# Patient Record
Sex: Female | Born: 1955 | Race: White | Hispanic: No | Smoking: Former smoker
Health system: Southern US, Community
[De-identification: ages and names within clinical notes are randomized; demographics above are authoritative.]

## PROBLEM LIST (undated history)

## (undated) DIAGNOSIS — E785 Hyperlipidemia, unspecified: Secondary | ICD-10-CM

## (undated) DIAGNOSIS — K219 Gastro-esophageal reflux disease without esophagitis: Secondary | ICD-10-CM

## (undated) DIAGNOSIS — J302 Other seasonal allergic rhinitis: Secondary | ICD-10-CM

## (undated) DIAGNOSIS — Z9889 Other specified postprocedural states: Secondary | ICD-10-CM

## (undated) DIAGNOSIS — I10 Essential (primary) hypertension: Secondary | ICD-10-CM

## (undated) DIAGNOSIS — E559 Vitamin D deficiency, unspecified: Secondary | ICD-10-CM

## (undated) DIAGNOSIS — R51 Headache: Secondary | ICD-10-CM

## (undated) DIAGNOSIS — R112 Nausea with vomiting, unspecified: Secondary | ICD-10-CM

## (undated) HISTORY — PX: LEG SURGERY: SHX1003

## (undated) HISTORY — PX: LAPAROTOMY: SHX154

## (undated) HISTORY — PX: APPENDECTOMY: SHX54

## (undated) HISTORY — PX: TUBAL LIGATION: SHX77

## (undated) HISTORY — PX: WISDOM TOOTH EXTRACTION: SHX21

---

## 2012-05-25 ENCOUNTER — Emergency Department (HOSPITAL_COMMUNITY): Payer: BC Managed Care – PPO

## 2012-05-25 ENCOUNTER — Emergency Department (HOSPITAL_COMMUNITY)
Admission: EM | Admit: 2012-05-25 | Discharge: 2012-05-25 | Disposition: A | Discharge status: Home / Self Care | Payer: BC Managed Care – PPO | Attending: Emergency Medicine | Admitting: Emergency Medicine

## 2012-05-25 ENCOUNTER — Encounter (HOSPITAL_COMMUNITY): Payer: Self-pay | Admitting: Emergency Medicine

## 2012-05-25 DIAGNOSIS — Y939 Activity, unspecified: Secondary | ICD-10-CM | POA: Insufficient documentation

## 2012-05-25 DIAGNOSIS — I10 Essential (primary) hypertension: Secondary | ICD-10-CM | POA: Insufficient documentation

## 2012-05-25 DIAGNOSIS — W1789XA Other fall from one level to another, initial encounter: Secondary | ICD-10-CM | POA: Insufficient documentation

## 2012-05-25 DIAGNOSIS — R109 Unspecified abdominal pain: Secondary | ICD-10-CM | POA: Insufficient documentation

## 2012-05-25 DIAGNOSIS — S99929A Unspecified injury of unspecified foot, initial encounter: Secondary | ICD-10-CM | POA: Insufficient documentation

## 2012-05-25 DIAGNOSIS — S8990XA Unspecified injury of unspecified lower leg, initial encounter: Secondary | ICD-10-CM | POA: Insufficient documentation

## 2012-05-25 DIAGNOSIS — R197 Diarrhea, unspecified: Secondary | ICD-10-CM | POA: Insufficient documentation

## 2012-05-25 DIAGNOSIS — Y929 Unspecified place or not applicable: Secondary | ICD-10-CM | POA: Insufficient documentation

## 2012-05-25 DIAGNOSIS — S0993XA Unspecified injury of face, initial encounter: Secondary | ICD-10-CM | POA: Insufficient documentation

## 2012-05-25 DIAGNOSIS — Z7982 Long term (current) use of aspirin: Secondary | ICD-10-CM | POA: Insufficient documentation

## 2012-05-25 DIAGNOSIS — R112 Nausea with vomiting, unspecified: Secondary | ICD-10-CM | POA: Insufficient documentation

## 2012-05-25 DIAGNOSIS — W19XXXA Unspecified fall, initial encounter: Secondary | ICD-10-CM

## 2012-05-25 DIAGNOSIS — IMO0001 Reserved for inherently not codable concepts without codable children: Secondary | ICD-10-CM | POA: Insufficient documentation

## 2012-05-25 HISTORY — DX: Essential (primary) hypertension: I10

## 2012-05-25 LAB — COMPREHENSIVE METABOLIC PANEL
Albumin: 4 g/dL (ref 3.5–5.2)
Alkaline Phosphatase: 67 U/L (ref 39–117)
BUN: 25 mg/dL — ABNORMAL HIGH (ref 6–23)
Calcium: 8.8 mg/dL (ref 8.4–10.5)
GFR calc Af Amer: >90 mL/min (ref >90)
Glucose, Bld: 133 mg/dL — ABNORMAL HIGH (ref 70–99)
Potassium: 3.6 mEq/L (ref 3.5–5.1)
Total Protein: 6.7 g/dL (ref 6.0–8.3)

## 2012-05-25 LAB — CBC WITH DIFFERENTIAL/PLATELET
Basophils Relative: 0 % (ref 0–1)
Eosinophils Absolute: 0 10*3/uL (ref 0.0–0.7)
Eosinophils Relative: 0 % (ref 0–5)
Hemoglobin: 14.1 g/dL (ref 12.0–15.0)
Lymphs Abs: 0.8 10*3/uL (ref 0.7–4.0)
MCH: 29.5 pg (ref 26.0–34.0)
MCHC: 34.8 g/dL (ref 30.0–36.0)
MCV: 84.7 fL (ref 78.0–100.0)
Monocytes Relative: 4 % (ref 3–12)
Neutrophils Relative %: 86 % — ABNORMAL HIGH (ref 43–77)
Platelets: 270 10*3/uL (ref 150–400)
RBC: 4.78 MIL/uL (ref 3.87–5.11)

## 2012-05-25 LAB — URINALYSIS, ROUTINE W REFLEX MICROSCOPIC
Bilirubin Urine: NEGATIVE
Hgb urine dipstick: NEGATIVE
Ketones, ur: NEGATIVE mg/dL
Nitrite: NEGATIVE
Urobilinogen, UA: 0.2 mg/dL (ref 0.0–1.0)

## 2012-05-25 LAB — LIPASE, BLOOD: Lipase: 19 U/L (ref 11–59)

## 2012-05-25 MED ORDER — IBUPROFEN 800 MG PO TABS: 800.0000 mg | ORAL_TABLET | Freq: Three times a day (TID) | ORAL | Status: DC

## 2012-05-25 MED ORDER — ONDANSETRON HCL 4 MG/2ML IJ SOLN
4.0000 mg | Freq: Once | INTRAMUSCULAR | Status: AC
  Administered 2012-05-25: 4 mg via INTRAVENOUS
  Filled 2012-05-25: qty 2

## 2012-05-25 MED ORDER — MORPHINE SULFATE 4 MG/ML IJ SOLN
4.0000 mg | Freq: Once | INTRAMUSCULAR | Status: AC
  Administered 2012-05-25: 4 mg via INTRAVENOUS
  Filled 2012-05-25 (×2): qty 1

## 2012-05-25 MED ORDER — IOHEXOL 300 MG/ML  SOLN
100.0000 mL | Freq: Once | INTRAMUSCULAR | Status: AC | PRN
  Administered 2012-05-25: 100 mL via INTRAVENOUS

## 2012-05-25 MED ORDER — SODIUM CHLORIDE 0.9 % IV BOLUS (SEPSIS)
1000.0000 mL | Freq: Once | INTRAVENOUS | Status: AC
  Administered 2012-05-25: 1000 mL via INTRAVENOUS

## 2012-05-25 NOTE — ED Provider Notes (Signed)
History   This chart was scribed for Victoria Octave, MD by Leone Payor, ED Scribe. This patient was seen in room APA01/APA01 and the patient's care was started at 1647.   CSN: [id number]  Arrival date & time 05/25/12  1405   First MD Initiated Contact with Patient 05/25/12 1647      Chief Complaint  Patient presents with  . Fall  . Flank Pain  . Emesis  . Diarrhea     The history is provided by the patient. No language interpreter was used.    Victoria Shannon is a 56 y.o. female who presents to the Emergency Department complaining of a fall landing on her left side. Pt denies any LOC and was able to pull herself up. Pt states having moderate to severe pain on whole left side, including left neck, left back, left flank down to left leg. Pt denies any head pain and any previous back pain. Pt states having nausea, vomiting, and diarrhea since last night.   Pt has h/o HTN and takes aspirin.   Past Medical History  Diagnosis Date  . Hypertension     Past Surgical History  Procedure Date  . Leg surgery     History reviewed. No pertinent family history.  History  Substance Use Topics  . Smoking status: Not on file  . Smokeless tobacco: Not on file  . Alcohol Use: No    OB History    Grav Para Term Preterm Abortions TAB SAB Ect Mult Living                  Review of Systems  Constitutional: Negative for fever and chills.  HENT: Positive for neck pain (left -sided).   Gastrointestinal: Positive for nausea, vomiting (last emesis last night. ) and diarrhea.  Musculoskeletal: Positive for myalgias (left side), back pain and arthralgias (left side).  Neurological: Negative for headaches.  All other systems reviewed and are negative.    Allergies  Penicillins  Home Medications  No current outpatient prescriptions on file.  BP 125/101  Pulse 60  Temp 98.8 F (37.1 C) (Oral)  Resp 17  Ht 5\' 5"  (1.651 m)  Wt 210 lb (95.255 kg)  BMI 34.95 kg/m2  SpO2  96%  Physical Exam  Nursing note and vitals reviewed. Constitutional: She is oriented to person, place, and time. She appears well-developed and well-nourished. No distress.  HENT:  Head: Normocephalic and atraumatic.  Eyes: EOM are normal.  Neck: Neck supple. No tracheal deviation present.  Cardiovascular: Normal rate, regular rhythm and normal heart sounds.   Pulmonary/Chest: Effort normal. No respiratory distress.  Abdominal: Soft. Bowel sounds are normal. There is no tenderness.  Musculoskeletal: Normal range of motion.       Paraspinal tenderness on left. Tenderness over left posterior ribs.  No c-spine tenderness.   5/5 strength in bilateral lower extremities. Ankle plantar and dorsiflexion intact. Great toe extension intact bilaterally. +2 DP and PT pulses. +2 patellar reflexes bilaterally. Normal gait.   Neurological: She is alert and oriented to person, place, and time.          Skin: Skin is warm and dry.  Psychiatric: She has a normal mood and affect. Her behavior is normal.    ED Course  Procedures (including critical care time)  DIAGNOSTIC STUDIES: Oxygen Saturation is 96% on room air, adequate by my interpretation.    COORDINATION OF CARE:  4:56 PMDiscussed treatment plan which includes x-ray and labs with pt at bedside and  pt agreed to plan.    Labs Reviewed  CBC WITH DIFFERENTIAL - Abnormal; Notable for the following:    Neutrophils Relative 86 (*)     Lymphocytes Relative 9 (*)     All other components within normal limits  COMPREHENSIVE METABOLIC PANEL - Abnormal; Notable for the following:    Glucose, Bld 133 (*)     BUN 25 (*)     All other components within normal limits  URINALYSIS, ROUTINE W REFLEX MICROSCOPIC  LIPASE, BLOOD   Dg Ribs Unilateral W/chest Left  05/25/2012  *RADIOLOGY REPORT*  Clinical Data: Posterior left rib pain secondary to a fall today.  LEFT RIBS AND CHEST - 3+ VIEW  Comparison: None.  Findings: There is no fracture.  There  is no lung contusion or pleural effusion.  Lungs are clear.  Heart size and vascularity are normal.  IMPRESSION: Normal exam.  No visible rib fracture.   Original Report Authenticated By: Francene Boyers, M.D.    Ct Head Wo Contrast  05/25/2012  *RADIOLOGY REPORT*  Clinical Data: Status post fall; vomiting and flank pain.  CT HEAD WITHOUT CONTRAST  Technique:  Contiguous axial images were obtained from the base of the skull through the vertex without contrast.  Comparison: None.  Findings: There is no evidence of acute infarction, mass lesion, or intra- or extra-axial hemorrhage on CT.  The posterior fossa, including the cerebellum, brainstem and fourth ventricle, is within normal limits.  The third and lateral ventricles, and basal ganglia are unremarkable in appearance.  The cerebral hemispheres are symmetric in appearance, with normal gray- white differentiation.  No mass effect or midline shift is seen.  There is no evidence of fracture; visualized osseous structures are unremarkable in appearance.  The orbits are within normal limits. The paranasal sinuses and mastoid air cells are well-aerated.  No significant soft tissue abnormalities are seen.  IMPRESSION: No evidence of traumatic intracranial injury or fracture.   Original Report Authenticated By: Tonia Ghent, M.D.    Ct Abdomen Pelvis W Contrast  05/25/2012  *RADIOLOGY REPORT*  Clinical Data: Left-sided flank pain with nausea and vomiting and diarrhea after a fall.  CT ABDOMEN AND PELVIS WITH CONTRAST  Technique:  Multidetector CT imaging of the abdomen and pelvis was performed following the standard protocol during bolus administration of intravenous contrast.  Contrast: OMNIPAQUE IOHEXOL 300 MG/ML  SOLN  Comparison: None.  Findings: The liver, spleen, pancreas, adrenal glands, and biliary tree are normal.  Small benign-appearing cysts on both kidneys.  No visible appendix.  Uterus and ovaries are normal.  There are a few diverticula in the  distal colon.  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  No acute osseous abnormality.  No muscle or other soft tissue abnormality.  IMPRESSION: No acute abnormality of the abdomen or pelvis.   Original Report Authenticated By: Francene Boyers, M.D.      No diagnosis found.    MDM  Mechanical slip and fall on a wheelchair ramp. Landed on left side. Did not hit head or lose consciousness. Nonfocal neuro exam.  No vomiting in ED. Imaging negative for traumatic injury. Patient tolerating by mouth in ED.   I personally performed the services described in this documentation, which was scribed in my presence. The recorded information has been reviewed and is accurate.    Victoria Octave, MD 05/25/12 [phone]

## 2012-05-25 NOTE — ED Notes (Signed)
Pt given sprite for fluid challenge °

## 2012-05-25 NOTE — ED Notes (Signed)
Pt states she slipped on run striking her left side. Pt c/o left side pain and n/v/d since last night.

## 2012-05-25 NOTE — ED Notes (Signed)
Pt has finished her contrast, ct notified,  

## 2012-05-25 NOTE — ED Notes (Signed)
Pt c/o falling due to slipping on wheelchair ramp a few days ago, n/v/d that started last night, Dr. Manus Gunning in room prior to RN, see EDP assessment for further.

## 2012-11-20 ENCOUNTER — Other Ambulatory Visit: Payer: Self-pay | Admitting: Physician Assistant

## 2012-11-20 DIAGNOSIS — Z1231 Encounter for screening mammogram for malignant neoplasm of breast: Secondary | ICD-10-CM

## 2013-01-08 ENCOUNTER — Encounter (HOSPITAL_COMMUNITY): Payer: Self-pay | Admitting: Pharmacy Technician

## 2013-01-13 ENCOUNTER — Other Ambulatory Visit: Payer: Self-pay | Admitting: Obstetrics and Gynecology

## 2013-01-16 ENCOUNTER — Encounter (HOSPITAL_COMMUNITY): Payer: Self-pay

## 2013-01-16 ENCOUNTER — Encounter (HOSPITAL_COMMUNITY)
Admission: RE | Admit: 2013-01-16 | Discharge: 2013-01-16 | Disposition: A | Discharge status: Home / Self Care | Payer: BC Managed Care – PPO | Source: Ambulatory Visit | Attending: Obstetrics and Gynecology | Admitting: Obstetrics and Gynecology

## 2013-01-16 HISTORY — DX: Vitamin D deficiency, unspecified: E55.9

## 2013-01-16 HISTORY — DX: Nausea with vomiting, unspecified: R11.2

## 2013-01-16 HISTORY — DX: Headache: R51

## 2013-01-16 HISTORY — DX: Gastro-esophageal reflux disease without esophagitis: K21.9

## 2013-01-16 HISTORY — DX: Other seasonal allergic rhinitis: J30.2

## 2013-01-16 HISTORY — DX: Hyperlipidemia, unspecified: E78.5

## 2013-01-16 HISTORY — DX: Nausea with vomiting, unspecified: Z98.890

## 2013-01-16 LAB — CBC
HCT: 37.4 % (ref 36.0–46.0)
Hemoglobin: 12.6 g/dL (ref 12.0–15.0)
MCV: 84.8 fL (ref 78.0–100.0)
RBC: 4.41 MIL/uL (ref 3.87–5.11)
RDW: 13.3 % (ref 11.5–15.5)
WBC: 7.7 10*3/uL (ref 4.0–10.5)

## 2013-01-16 LAB — BASIC METABOLIC PANEL
CO2: 26 mEq/L (ref 19–32)
Chloride: 103 mEq/L (ref 96–112)
GFR calc Af Amer: >90 mL/min (ref >90)
Potassium: 4.1 mEq/L (ref 3.5–5.1)
Sodium: 138 mEq/L (ref 135–145)

## 2013-01-16 NOTE — Patient Instructions (Addendum)
   Your procedure is scheduled on: Wednesday, July 30  Enter through the [employer] of [facility] at: 1030 am Pick up the phone at the desk and dial [phone] and inform us of your arrival.  Please call this number if you have any problems the morning of surgery: [phone]  Remember: Do not eat after midnight: Tuesday.  Do not drink/clears after 8am Wednesday day of surgery Take these medicines the morning of surgery with a SIP OF WATER:  None (BP med is taken q hs)  Do not wear jewelry, make-up, or FINGER nail polish No metal in your hair or on your body. Do not wear lotions, powders, perfumes. You may wear deodorant.  Please use your CHG wash as directed prior to surgery.  Do not shave anywhere for at least 12 hours prior to first CHG shower.  Do not bring valuables to the hospital. Contacts, dentures or bridgework may not be worn into surgery.  Patients discharged on the day of surgery will not be allowed to drive home.  Home with daughter Carollee Herter.

## 2013-01-16 NOTE — Pre-Procedure Instructions (Signed)
Reviewed patient's history with Dr Malen Gauze.  Patient had EKG done in 10/2012 with Primary and requested a copy.  Notes was faxed showing patient had Left bundle block , sinus rhythm.  Ok per Dr Malen Gauze for surgery 12/3012.  Patient has appt 02/12/13 with cardiologist Dr Fayrene Fearing Hiocherin to follow up on LBB.  Notes placed on chart.

## 2013-01-20 NOTE — H&P (Signed)
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 Hypertension   . SVD (spontaneous vaginal delivery) x 5  . PONV (postoperative nausea and vomiting)   . Hyperlipidemia     diet controlled  . Vitamin D deficiency   . Seasonal allergies   . GERD (gastroesophageal reflux disease)     tums prn  . Headache(784.0)     otc meds prn   Past Surgical History  Procedure Laterality Date  . Leg surgery      left leg with metal plates and pins/screws  . Wisdom tooth extraction    . Laparotomy      removed cyst of left ovary  . Appendectomy    . Tubal ligation    Scheduled Meds:Lisinopril Continuous Infusions: PRN Meds:.   Allergies  Allergen Reactions  . Penicillins Rash  No family history on file. History   Social History  . Marital Status: Divorced    Spouse Name: N/A    Number of Children: N/A  . Years of Education: N/A   Occupational History  . Not on file.   Social History Main Topics  . Smoking status: Former Smoker -- 2.00 packs/day for 20 years    Types: Cigarettes    Quit date: 12/23/1992  . Smokeless tobacco: Never Used  . Alcohol Use: No  . Drug Use: No  . Sexually Active: Yes    Birth Control/ Protection: Surgical   Other Topics Concern  . Not on file   Social History Narrative  . No narrative on file  negative for tobacco and alocohol use No family history on file.   Family history sig for CHTN VS 5'5", 122/82 weight 212 Physical Examination: General appearance - alert, well appearing, and in no distress Lymphatics - no palpable lymphadenopathy, no hepatosplenomegaly Heart - normal rate and regular rhythm Abdomen - soft, nontender, nondistended, no masses or organomegaly Pelvic - normal external genitalia, vulva, vagina, cervix, uterus and adnexa Extremities - peripheral pulses normal, no pedal edema, no clubbing or cyanosis CIN 2 Pt offered cryo vs LEEP R&B reviewed with the pt Pt chose  LEEP Pt told the risks are but not limited to bleeding, infection, damage to vagina, cervix and uterus.  May need repeat LEEP or more surgery.   Pt voiced understanding and agrees to proceed

## 2013-01-21 ENCOUNTER — Ambulatory Visit (HOSPITAL_COMMUNITY)
Admission: RE | Admit: 2013-01-21 | Discharge: 2013-01-21 | Disposition: A | Discharge status: Home / Self Care | Payer: BC Managed Care – PPO | Source: Ambulatory Visit | Attending: Obstetrics and Gynecology | Admitting: Obstetrics and Gynecology

## 2013-01-21 ENCOUNTER — Encounter (HOSPITAL_COMMUNITY)
Admission: RE | Disposition: A | Discharge status: Home / Self Care | Payer: Self-pay | Source: Ambulatory Visit | Attending: Obstetrics and Gynecology

## 2013-01-21 ENCOUNTER — Ambulatory Visit (HOSPITAL_COMMUNITY): Payer: BC Managed Care – PPO | Admitting: Anesthesiology

## 2013-01-21 ENCOUNTER — Encounter (HOSPITAL_COMMUNITY): Payer: Self-pay | Admitting: Anesthesiology

## 2013-01-21 DIAGNOSIS — N871 Moderate cervical dysplasia: Secondary | ICD-10-CM | POA: Insufficient documentation

## 2013-01-21 DIAGNOSIS — I10 Essential (primary) hypertension: Secondary | ICD-10-CM | POA: Insufficient documentation

## 2013-01-21 DIAGNOSIS — Z9889 Other specified postprocedural states: Secondary | ICD-10-CM

## 2013-01-21 HISTORY — PX: LEEP: SHX91

## 2013-01-21 SURGERY — LEEP (LOOP ELECTROSURGICAL EXCISION PROCEDURE)
Anesthesia: General | Site: Vagina | Wound class: Clean Contaminated

## 2013-01-21 MED ORDER — MIDAZOLAM HCL 5 MG/5ML IJ SOLN
INTRAMUSCULAR | Status: DC | PRN
  Administered 2013-01-21: 2 mg via INTRAVENOUS

## 2013-01-21 MED ORDER — GLYCOPYRROLATE 0.2 MG/ML IJ SOLN
INTRAMUSCULAR | Status: AC
  Filled 2013-01-21: qty 1

## 2013-01-21 MED ORDER — LIDOCAINE HCL (CARDIAC) 20 MG/ML IV SOLN
INTRAVENOUS | Status: DC | PRN
  Administered 2013-01-21: 80 mg via INTRAVENOUS

## 2013-01-21 MED ORDER — KETOROLAC TROMETHAMINE 30 MG/ML IJ SOLN
INTRAMUSCULAR | Status: DC | PRN
  Administered 2013-01-21: 30 mg via INTRAVENOUS

## 2013-01-21 MED ORDER — FENTANYL CITRATE 0.05 MG/ML IJ SOLN: 25.0000 ug | INTRAMUSCULAR | Status: DC | PRN

## 2013-01-21 MED ORDER — FERRIC SUBSULFATE SOLN
Status: DC | PRN
  Administered 2013-01-21: 2

## 2013-01-21 MED ORDER — IBUPROFEN 600 MG PO TABS: 600.0000 mg | ORAL_TABLET | Freq: Four times a day (QID) | ORAL | Status: DC | PRN

## 2013-01-21 MED ORDER — FENTANYL CITRATE 0.05 MG/ML IJ SOLN
INTRAMUSCULAR | Status: AC
  Filled 2013-01-21: qty 4

## 2013-01-21 MED ORDER — FENTANYL CITRATE 0.05 MG/ML IJ SOLN
INTRAMUSCULAR | Status: DC | PRN
  Administered 2013-01-21 (×4): 50 ug via INTRAVENOUS

## 2013-01-21 MED ORDER — DEXAMETHASONE SODIUM PHOSPHATE 4 MG/ML IJ SOLN
INTRAMUSCULAR | Status: DC | PRN
  Administered 2013-01-21: 10 mg via INTRAVENOUS

## 2013-01-21 MED ORDER — LIDOCAINE HCL (CARDIAC) 20 MG/ML IV SOLN
INTRAVENOUS | Status: AC
  Filled 2013-01-21: qty 5

## 2013-01-21 MED ORDER — LIDOCAINE HCL 2 % IJ SOLN
INTRAMUSCULAR | Status: AC
  Filled 2013-01-21: qty 20

## 2013-01-21 MED ORDER — ONDANSETRON HCL 4 MG/2ML IJ SOLN
INTRAMUSCULAR | Status: AC
  Filled 2013-01-21: qty 2

## 2013-01-21 MED ORDER — PROPOFOL 10 MG/ML IV BOLUS
INTRAVENOUS | Status: DC | PRN
  Administered 2013-01-21: 50 mg via INTRAVENOUS
  Administered 2013-01-21: 150 mg via INTRAVENOUS

## 2013-01-21 MED ORDER — MIDAZOLAM HCL 2 MG/2ML IJ SOLN
INTRAMUSCULAR | Status: AC
  Filled 2013-01-21: qty 2

## 2013-01-21 MED ORDER — DEXAMETHASONE SODIUM PHOSPHATE 10 MG/ML IJ SOLN
INTRAMUSCULAR | Status: AC
  Filled 2013-01-21: qty 1

## 2013-01-21 MED ORDER — ONDANSETRON HCL 4 MG/2ML IJ SOLN
INTRAMUSCULAR | Status: DC | PRN
  Administered 2013-01-21: 4 mg via INTRAVENOUS

## 2013-01-21 MED ORDER — PROPOFOL 10 MG/ML IV EMUL
INTRAVENOUS | Status: AC
  Filled 2013-01-21: qty 20

## 2013-01-21 MED ORDER — IODINE STRONG (LUGOLS) 5 % PO SOLN
ORAL | Status: DC | PRN
  Administered 2013-01-21: 2 mL via ORAL

## 2013-01-21 MED ORDER — LIDOCAINE HCL 2 % IJ SOLN
INTRAMUSCULAR | Status: DC | PRN
  Administered 2013-01-21: 20 mL

## 2013-01-21 MED ORDER — KETOROLAC TROMETHAMINE 30 MG/ML IJ SOLN
INTRAMUSCULAR | Status: AC
  Filled 2013-01-21: qty 1

## 2013-01-21 MED ORDER — LACTATED RINGERS IV SOLN
INTRAVENOUS | Status: DC
  Administered 2013-01-21 (×2): via INTRAVENOUS

## 2013-01-21 MED ORDER — ACETIC ACID 4% SOLUTION
Status: DC | PRN
  Administered 2013-01-21: 1 via TOPICAL

## 2013-01-21 SURGICAL SUPPLY — 38 items
APPLICATOR COTTON TIP 6IN STRL (MISCELLANEOUS) ×2 IMPLANT
CLOTH BEACON ORANGE TIMEOUT ST (SAFETY) ×2 IMPLANT
COUNTER NEEDLE 1200 MAGNETIC (NEEDLE) IMPLANT
DRESSING TELFA 8X3 (GAUZE/BANDAGES/DRESSINGS) ×2 IMPLANT
ELECT BALL LEEP 5MM RED (ELECTRODE) ×2 IMPLANT
ELECT LOOP LEEP RND 15X12 GRN (CUTTING LOOP)
ELECT LOOP LEEP RND 20X12 WHT (CUTTING LOOP)
ELECT REM PT RETURN 9FT ADLT (ELECTROSURGICAL) ×2
ELECTRODE LOOP LP RND 15X12GRN (CUTTING LOOP) IMPLANT
ELECTRODE LOOP LP RND 20X12WHT (CUTTING LOOP) IMPLANT
ELECTRODE REM PT RTRN 9FT ADLT (ELECTROSURGICAL) ×1 IMPLANT
EVACUATOR PREFILTER SMOKE (MISCELLANEOUS) ×2 IMPLANT
EXTENDER ELECT LOOP LEEP 10CM (CUTTING LOOP) IMPLANT
GAUZE SPONGE 4X4 16PLY XRAY LF (GAUZE/BANDAGES/DRESSINGS) IMPLANT
GLOVE BIO SURGEON STRL SZ 6.5 (GLOVE) ×2 IMPLANT
GLOVE BIOGEL PI IND STRL 7.0 (GLOVE) ×1 IMPLANT
GLOVE BIOGEL PI INDICATOR 7.0 (GLOVE) ×1
GOWN STRL REIN XL XLG (GOWN DISPOSABLE) ×4 IMPLANT
HOSE NS SMOKE EVAC 7/8 X6 (MISCELLANEOUS) ×2 IMPLANT
NEEDLE SPNL 22GX3.5 QUINCKE BK (NEEDLE) ×2 IMPLANT
NS IRRIG 1000ML POUR BTL (IV SOLUTION) ×2 IMPLANT
PACK VAGINAL MINOR WOMEN LF (CUSTOM PROCEDURE TRAY) ×2 IMPLANT
PAD OB MATERNITY 4.3X12.25 (PERSONAL CARE ITEMS) ×2 IMPLANT
PENCIL BUTTON HOLSTER BLD 10FT (ELECTRODE) ×2 IMPLANT
REDUCER FITTING SMOKE EVAC (MISCELLANEOUS) ×2 IMPLANT
SCOPETTES 8  STERILE (MISCELLANEOUS) ×3
SCOPETTES 8 STERILE (MISCELLANEOUS) ×3 IMPLANT
SPONGE SURGIFOAM ABS GEL 12-7 (HEMOSTASIS) IMPLANT
SUT VIC AB 0 CT1 27 (SUTURE)
SUT VIC AB 0 CT1 27XBRD ANBCTR (SUTURE) IMPLANT
SUT VIC AB 2-0 SH 27 (SUTURE)
SUT VIC AB 2-0 SH 27XBRD (SUTURE) IMPLANT
SYR 20CC LL (SYRINGE) ×2 IMPLANT
TOWEL OR 17X24 6PK STRL BLUE (TOWEL DISPOSABLE) ×4 IMPLANT
TUBING NON-CON 1/4 X 20 CONN (TUBING) IMPLANT
TUBING SMOKE EVAC HOSE ADAPTER (MISCELLANEOUS) ×2 IMPLANT
WATER STERILE IRR 1000ML POUR (IV SOLUTION) ×2 IMPLANT
YANKAUER SUCT BULB TIP NO VENT (SUCTIONS) IMPLANT

## 2013-01-21 NOTE — Anesthesia Procedure Notes (Signed)
Procedure Name: LMA Insertion Date/Time: 01/21/2013 1:44 PM Performed by: Graciela Husbands Pre-anesthesia Checklist: Patient identified, Patient being monitored, Timeout performed, Emergency Drugs available and Suction available Patient Re-evaluated:Patient Re-evaluated prior to inductionOxygen Delivery Method: Circle system utilized Preoxygenation: Pre-oxygenation with 100% oxygen Intubation Type: IV induction LMA: LMA inserted LMA Size: 4.0 Number of attempts: 1 Placement Confirmation: breath sounds checked- equal and bilateral and positive ETCO2 Tube secured with: Tape Dental Injury: Teeth and Oropharynx as per pre-operative assessment

## 2013-01-21 NOTE — Op Note (Signed)
Patient identified, informed consent obtained, signed copy in chart, time out performed.  Pap smear and colposcopy reviewed.   Pap HGSIL Colpo Biopsy CIN2 ECC BENIGN Teflon coated speculum with smoke evacuator placed.  Cervix visualized. Paracervical block placed.   LOOP used to remove cone of cervix using blend of cut and cautery on LEEP machine.  Edges/Base cauterized with Ball.  Monsel's solution used for hemostasis.  Patient tolerated procedure well.  Patient given post procedure instructions.  Follow up in 4 months for repeat pap or as needed.

## 2013-01-21 NOTE — Transfer of Care (Signed)
Immediate Anesthesia Transfer of Care Note  Patient: Victoria Shannon  Procedure(s) Performed: Procedure(s) with comments: LOOP ELECTROSURGICAL EXCISION PROCEDURE (LEEP) (N/A) - WITH ECC  Patient Location: PACU  Anesthesia Type:General  Level of Consciousness: awake, alert  and oriented  Airway & Oxygen Therapy: Patient Spontanous Breathing and Patient connected to nasal cannula oxygen  Post-op Assessment: Report given to PACU RN and Post -op Vital signs reviewed and stable  Post vital signs: Reviewed and stable  Complications: No apparent anesthesia complications

## 2013-01-21 NOTE — Anesthesia Preprocedure Evaluation (Addendum)
Anesthesia Evaluation  Patient identified by MRN, date of birth, ID band Patient awake    Reviewed: Allergy & Precautions, H&P , Patient's Chart, lab work & pertinent test results, reviewed documented beta blocker date and time   Airway Mallampati: II TM Distance: >3 FB Neck ROM: full    Dental no notable dental hx.    Pulmonary  breath sounds clear to auscultation  Pulmonary exam normal       Cardiovascular hypertension, On Medications Rhythm:regular Rate:Normal     Neuro/Psych    GI/Hepatic   Endo/Other    Renal/GU      Musculoskeletal   Abdominal   Peds  Hematology   Anesthesia Other Findings   Reproductive/Obstetrics                           Anesthesia Physical Anesthesia Plan  ASA: II  Anesthesia Plan: MAC   Post-op Pain Management:    Induction: Intravenous  Airway Management Planned: LMA, Mask and Natural Airway  Additional Equipment:   Intra-op Plan:   Post-operative Plan:   Informed Consent: I have reviewed the patients History and Physical, chart, labs and discussed the procedure including the risks, benefits and alternatives for the proposed anesthesia with the patient or authorized representative who has indicated his/her understanding and acceptance.   Dental Advisory Given  Plan Discussed with: CRNA and Surgeon  Anesthesia Plan Comments: (MAC; Consider LMA if necessary)       Anesthesia Quick Evaluation

## 2013-01-21 NOTE — Anesthesia Postprocedure Evaluation (Signed)
  Anesthesia Post-op Note  Patient: Victoria Shannon  Procedure(s) Performed: Procedure(s) with comments: LOOP ELECTROSURGICAL EXCISION PROCEDURE (LEEP) (N/A) - WITH ECC  Patient Location: PACU  Anesthesia Type:General  Level of Consciousness: awake, alert  and oriented  Airway and Oxygen Therapy: Patient Spontanous Breathing  Post-op Pain: none  Post-op Assessment: Post-op Vital signs reviewed, Patient's Cardiovascular Status Stable, Respiratory Function Stable, Patent Airway, No signs of Nausea or vomiting and Pain level controlled  Post-op Vital Signs: Reviewed and stable  Complications: No apparent anesthesia complications

## 2013-01-23 ENCOUNTER — Ambulatory Visit
Admission: RE | Admit: 2013-01-23 | Discharge: 2013-01-23 | Disposition: A | Discharge status: Home / Self Care | Payer: BC Managed Care – PPO | Source: Ambulatory Visit | Attending: Physician Assistant | Admitting: Physician Assistant

## 2013-01-23 DIAGNOSIS — Z1231 Encounter for screening mammogram for malignant neoplasm of breast: Secondary | ICD-10-CM

## 2013-02-10 ENCOUNTER — Other Ambulatory Visit: Payer: Self-pay | Admitting: Gastroenterology

## 2013-02-10 ENCOUNTER — Ambulatory Visit (INDEPENDENT_AMBULATORY_CARE_PROVIDER_SITE_OTHER): Payer: BC Managed Care – PPO | Admitting: Cardiology

## 2013-02-10 ENCOUNTER — Encounter: Payer: Self-pay | Admitting: Cardiology

## 2013-02-10 VITALS — BP 150/92 | HR 76 | Ht 66.0 in | Wt 213.4 lb

## 2013-02-10 DIAGNOSIS — R9431 Abnormal electrocardiogram [ECG] [EKG]: Secondary | ICD-10-CM

## 2013-02-10 DIAGNOSIS — R1011 Right upper quadrant pain: Secondary | ICD-10-CM

## 2013-02-10 DIAGNOSIS — Z8249 Family history of ischemic heart disease and other diseases of the circulatory system: Secondary | ICD-10-CM

## 2013-02-10 DIAGNOSIS — R110 Nausea: Secondary | ICD-10-CM

## 2013-02-10 NOTE — Progress Notes (Signed)
HPI The patient presents for evaluation of an abnormal EKG. She has a history of a left bundle branch block which she says goes back perhaps 15 years. She has never had any cardiac workup for this as far she knows. She also has a very dramatic family history as described below. She herself has never had any cardiac symptoms or workup. She walks her to and does her activities of daily living. With this she denies any chest pressure, neck or arm discomfort. She doesn't have any shortness of breath, PND or orthopnea. She doesn't have any palpitations, presyncope or syncope. She has had no weight gain or edema.  Allergies  Allergen Reactions  . Penicillins Rash    Current Outpatient Prescriptions  Medication Sig Dispense Refill  . aspirin EC 81 MG tablet Take 81 mg by mouth daily.      . calcium carbonate (OS-CAL - DOSED IN MG OF ELEMENTAL CALCIUM) 1250 MG tablet Take 2 tablets by mouth daily with breakfast.      . cholecalciferol (VITAMIN D) 1000 UNITS tablet Take 1,000 Units by mouth daily.      . diphenhydrAMINE (BENADRYL) 25 MG tablet Take 50 mg by mouth every 6 (six) hours as needed for itching or allergies.      Marland Kitchen ibuprofen (ADVIL,MOTRIN) 600 MG tablet Take 1 tablet (600 mg total) by mouth every 6 (six) hours as needed for pain.  30 tablet  0  . lisinopril (PRINIVIL,ZESTRIL) 20 MG tablet Take 20 mg by mouth daily.      . Multiple Vitamin (MULTIVITAMIN WITH MINERALS) TABS Take 1 tablet by mouth daily.       No current facility-administered medications for this visit.    Past Medical History  Diagnosis Date  . Hypertension   . SVD (spontaneous vaginal delivery) x 5  . PONV (postoperative nausea and vomiting)   . Hyperlipidemia     diet controlled  . Vitamin D deficiency   . Seasonal allergies   . GERD (gastroesophageal reflux disease)     tums prn  . Headache(784.0)     otc meds prn    Past Surgical History  Procedure Laterality Date  . Leg surgery      left leg with metal  plates and pins/screws  . Wisdom tooth extraction    . Laparotomy      removed cyst of left ovary  . Appendectomy    . Tubal ligation    . Leep N/A 01/21/2013    Procedure: LOOP ELECTROSURGICAL EXCISION PROCEDURE (LEEP);  Surgeon: Michael Litter, MD;  Location: [facility];  Service: Gynecology;  Laterality: N/A;  WITH ECC    No family history on file.  History   Social History  . Marital Status: Divorced    Spouse Name: N/A    Number of Children: N/A  . Years of Education: N/A   Occupational History  . Not on file.   Social History Main Topics  . Smoking status: Former Smoker -- 2.00 packs/day for 20 years    Types: Cigarettes    Quit date: 12/23/1992  . Smokeless tobacco: Never Used  . Alcohol Use: No  . Drug Use: No  . Sexual Activity: Yes    Birth Control/ Protection: Surgical   Other Topics Concern  . Not on file   Social History Narrative  . No narrative on file    ROS:    Headaches.  Otherwise as stated in the HPI and negative for all other systems.  PHYSICAL EXAM BP 150/92  Pulse 76  Ht 5\' 6"  (1.676 m)  Wt 213 lb 6.4 oz (96.798 kg)  BMI 34.46 kg/m2 GENERAL:  Well appearing HEENT:  Pupils equal round and reactive, fundi not visualized, oral mucosa unremarkable NECK:  No jugular venous distention, waveform within normal limits, carotid upstroke brisk and symmetric, no bruits, no thyromegaly LYMPHATICS:  No cervical, inguinal adenopathy LUNGS:  Clear to auscultation bilaterally BACK:  No CVA tenderness CHEST:  Unremarkable HEART:  PMI not displaced or sustained,S1 and S2 within normal limits, no S3, no S4, no clicks, no rubs, no murmurs ABD:  Flat, positive bowel sounds normal in frequency in pitch, no bruits, no rebound, no guarding, no midline pulsatile mass, no hepatomegaly, no splenomegaly EXT:  2 plus pulses throughout, no edema, no cyanosis no clubbing SKIN:  No rashes no nodules NEURO:  Cranial nerves II through XII grossly intact, motor grossly  intact throughout PSYCH:  Cognitively intact, oriented to person place and time   EKG:  Sinus rhythm, rate 76, left bundle branch block, no acute ST-T wave changes. 02/10/2013  ASSESSMENT AND PLAN  LBBB:  Assessment chronic problem. By itself this would not need further workup if she has no symptoms. However, I will be addressing her significant risk factors as described below.  FAMILY HISTORY:  Given his very strong family history I suggest screening with a coronary calcium score. I would typically do an exercise treadmill test but this would be uninterpretable with her left bundle branch block. I would be aggressive with other risk factors as well and she and I will discuss this based on the results of the coronary calcium score. She needs aggressive risk reduction with exercise as a significant component.  HTN:  Her blood pressure is elevated typically has not been. She will continue with meds as listed.

## 2013-02-10 NOTE — Patient Instructions (Addendum)
The current medical regimen is effective;  continue present plan and medications.  Your physician has requested that you have cardiac CA score. Cardiac computed tomography (CT) is a painless test that uses an x-ray machine to take clear, detailed pictures of your heart. This testing is done here in this office and does not have any prior restrictions.  Follow will be based on results and on an as needed basis.

## 2013-02-12 ENCOUNTER — Institutional Professional Consult (permissible substitution): Payer: BC Managed Care – PPO | Admitting: Cardiology

## 2013-02-24 ENCOUNTER — Other Ambulatory Visit: Payer: BC Managed Care – PPO

## 2013-02-27 ENCOUNTER — Ambulatory Visit (HOSPITAL_COMMUNITY): Payer: BC Managed Care – PPO

## 2013-02-27 ENCOUNTER — Encounter (HOSPITAL_COMMUNITY): Payer: BC Managed Care – PPO

## 2013-03-25 ENCOUNTER — Ambulatory Visit (HOSPITAL_COMMUNITY)
Admission: RE | Admit: 2013-03-25 | Discharge: 2013-03-25 | Disposition: A | Discharge status: Home / Self Care | Payer: BC Managed Care – PPO | Source: Ambulatory Visit | Attending: Gastroenterology | Admitting: Gastroenterology

## 2013-03-25 ENCOUNTER — Ambulatory Visit (INDEPENDENT_AMBULATORY_CARE_PROVIDER_SITE_OTHER)
Admission: RE | Admit: 2013-03-25 | Discharge: 2013-03-25 | Disposition: A | Discharge status: Home / Self Care | Payer: Self-pay | Source: Ambulatory Visit | Attending: Cardiology | Admitting: Cardiology

## 2013-03-25 ENCOUNTER — Encounter (HOSPITAL_COMMUNITY)
Admission: RE | Admit: 2013-03-25 | Discharge: 2013-03-25 | Disposition: A | Discharge status: Home / Self Care | Payer: BC Managed Care – PPO | Source: Ambulatory Visit | Attending: Gastroenterology | Admitting: Gastroenterology

## 2013-03-25 DIAGNOSIS — R110 Nausea: Secondary | ICD-10-CM

## 2013-03-25 DIAGNOSIS — K7689 Other specified diseases of liver: Secondary | ICD-10-CM | POA: Insufficient documentation

## 2013-03-25 DIAGNOSIS — Z8249 Family history of ischemic heart disease and other diseases of the circulatory system: Secondary | ICD-10-CM

## 2013-03-25 DIAGNOSIS — R1011 Right upper quadrant pain: Secondary | ICD-10-CM | POA: Insufficient documentation

## 2013-03-25 DIAGNOSIS — R9431 Abnormal electrocardiogram [ECG] [EKG]: Secondary | ICD-10-CM

## 2013-03-25 MED ORDER — TECHNETIUM TC 99M MEBROFENIN IV KIT
5.0000 | PACK | Freq: Once | INTRAVENOUS | Status: AC | PRN
  Administered 2013-03-25: 5 via INTRAVENOUS

## 2013-04-16 ENCOUNTER — Ambulatory Visit (INDEPENDENT_AMBULATORY_CARE_PROVIDER_SITE_OTHER): Payer: BC Managed Care – PPO | Admitting: General Surgery

## 2013-04-16 ENCOUNTER — Encounter (INDEPENDENT_AMBULATORY_CARE_PROVIDER_SITE_OTHER): Payer: Self-pay | Admitting: General Surgery

## 2013-04-16 VITALS — BP 120/80 | HR 84 | Temp 98.0°F | Resp 15 | Ht 65.0 in | Wt 215.0 lb

## 2013-04-16 DIAGNOSIS — K828 Other specified diseases of gallbladder: Secondary | ICD-10-CM

## 2013-04-16 NOTE — Progress Notes (Signed)
Patient ID: Victoria Shannon, female   DOB: 1956/03/20, 57 y.o.   MRN: [id number]  Chief Complaint  Patient presents with  . New Evaluation    eval GB LEF    HPI Victoria Shannon is a 57 y.o. female. This patient was referred by Dr. Loreta Ave for evaluation of biliary dyskinesia. She says that she has had a long-standing history of right upper back pain which she describes as a "knife like pain" twisting in her back with associated goal aching pain under her right rib in her right upper quadrant. She has been recently evaluated by gastroenterology for stomach symptoms and she has been treated for heartburn and esophageal ulcers with Dexilant and she has had good improvement of the symptoms with the medication. However she continues to have her shoulder pain and upper abdominal pain about once per week. She has associated nausea but only occasional vomiting. She says that her symptoms are worse with eating. She recently had an EGD and colonoscopy and by patient report her EGD demonstrated esophageal erosions and hiatal hernia and she had diverticulosis on her colonoscopy.  She says that her bowels are normal denies any blood in the stools or melena. She had an ultrasound which did not demonstrate any gallstones Jenna hydroscan with a low ejection fraction of 32% with a normal reference range of 33%. She also recently saw a cardiologist and she tells me that everything was okay with her visit.  HPI  Past Medical History  Diagnosis Date  . Hypertension   . SVD (spontaneous vaginal delivery) x 5  . PONV (postoperative nausea and vomiting)   . Hyperlipidemia     diet controlled  . Vitamin D deficiency   . Seasonal allergies   . GERD (gastroesophageal reflux disease)     tums prn  . Headache(784.0)     otc meds prn    Past Surgical History  Procedure Laterality Date  . Leg surgery      left leg with metal plates and pins/screws  . Wisdom tooth extraction    . Laparotomy      removed cyst of left  ovary  . Appendectomy    . Tubal ligation    . Leep N/A 01/21/2013    Procedure: LOOP ELECTROSURGICAL EXCISION PROCEDURE (LEEP);  Surgeon: Michael Litter, MD;  Location: [facility];  Service: Gynecology;  Laterality: N/A;  WITH ECC    Family History  Problem Relation Age of Onset  . CAD Brother 98    Fatal MI  . CAD Brother 48    CABG  . CAD Mother 88    CAD, died age 64  . Heart disease Mother   . Heart disease Father     AICD.  Died age 21    Social History History  Substance Use Topics  . Smoking status: Former Smoker -- 2.00 packs/day for 20 years    Types: Cigarettes    Quit date: 12/23/1992  . Smokeless tobacco: Never Used  . Alcohol Use: No    Allergies  Allergen Reactions  . Penicillins Rash    Current Outpatient Prescriptions  Medication Sig Dispense Refill  . cholecalciferol (VITAMIN D) 1000 UNITS tablet Take 1,000 Units by mouth daily.      Marland Kitchen dexlansoprazole (DEXILANT) 60 MG capsule Take 60 mg by mouth daily.      . diphenhydrAMINE (BENADRYL) 25 MG tablet Take 50 mg by mouth every 6 (six) hours as needed for itching or allergies.      Marland Kitchen  lisinopril (PRINIVIL,ZESTRIL) 20 MG tablet Take 20 mg by mouth daily.      . Multiple Vitamin (MULTIVITAMIN WITH MINERALS) TABS Take 1 tablet by mouth daily.       No current facility-administered medications for this visit.    Review of Systems Review of Systems All other review of systems negative or noncontributory except as stated in the HPI  Blood pressure 120/80, pulse 84, temperature 98 F (36.7 C), temperature source Temporal, resp. rate 15, height 5\' 5"  (1.651 m), weight 215 lb (97.523 kg).  Physical Exam Physical Exam Physical Exam  Nursing note and vitals reviewed. Constitutional: She is oriented to person, place, and time. She appears well-developed and well-nourished. No distress.  HENT:  Head: Normocephalic and atraumatic.  Mouth/Throat: No oropharyngeal exudate.  Eyes: Conjunctivae and EOM are normal.  Pupils are equal, round, and reactive to light. Right eye exhibits no discharge. Left eye exhibits no discharge. No scleral icterus.  Neck: Normal range of motion. Neck supple. No tracheal deviation present.  Cardiovascular: Normal rate, regular rhythm, normal heart sounds and intact distal pulses.   Pulmonary/Chest: Effort normal and breath sounds normal. No stridor. No respiratory distress. She has no wheezes.  Abdominal: Soft. Bowel sounds are normal. She exhibits no distension and no mass. There is no tenderness. There is no rebound and no guarding.  Musculoskeletal: Normal range of motion. She exhibits no edema and no tenderness.  Neurological: She is alert and oriented to person, place, and time.  Skin: Skin is warm and dry. No rash noted. She is not diaphoretic. No erythema. No pallor.  Psychiatric: She has a normal mood and affect. Her behavior is normal. Judgment and thought content normal.    Data Reviewed [place], hida  Assessment    Biliary dyskinesia I agree with her gastroenterologist that her symptoms are most likely due to biliary dyskinesia. Her symptoms sound pretty classic for gallbladder type symptoms. Her HIDA scan was just marginally low but again, her symptoms sound very typical. She has been treated for her gastric symptoms and she continues to have her right upper quadrant pain and back pain which I think are due to her gallbladder. She has had EGD and colonoscopy without any other obvious source of her symptoms. I think that she would likely benefit from cholecystectomy. I discussed with her the procedure and its risks. The risks of infection, bleeding, pain, persistent symptoms, scarring, injury to bowel or bile ducts, retained stone, diarrhea, need for additional procedures, and need for open surgery discussed with the patient. She would like to take some time to think about her options and she will call me back if she would like to go ahead and schedule cholecystectomy. I  provided her with some additional information and again, she will call me back if she would like to proceed with cholecystectomy.    Plan    We would be happy to offer her cholecystectomy if she desires.  She will call me back if she would like to proceed with surgery.        Lodema Pilot DAVID 04/16/2013, 9:34 AM

## 2014-05-24 IMAGING — CT CT HEART SCORING
1 of 3 series · 10 of 20 positions shown, 13 images · non-contrast
Comparison: None.

ADDENDUM:
CardiacCalcium Score:
INDICATION: Risk Stratification
PROTOCOL: The patient was scanned on a SiemensSensation 16 slice
scanner 3mm axial non contrast slices were carried outthrough the
heart. The data set was scored using the Agatson method using a
dedicatedwork station.

[Series 6: st thins for reformat · axial · 0.75mm/px · z∈[-213,-132]mm · 10 of 99 slices shown, 13 images]
[im 9/99  vessel]
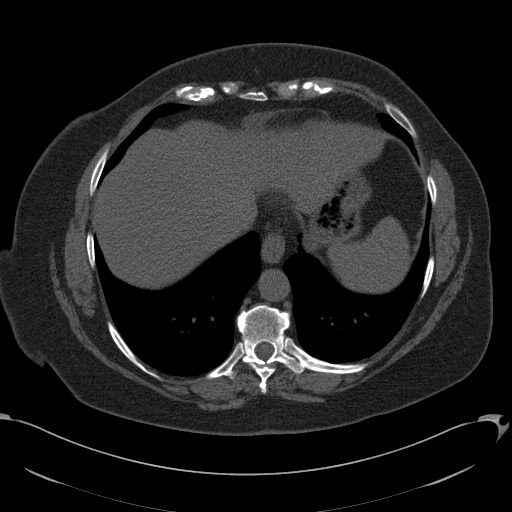
[im 9/99  lung]
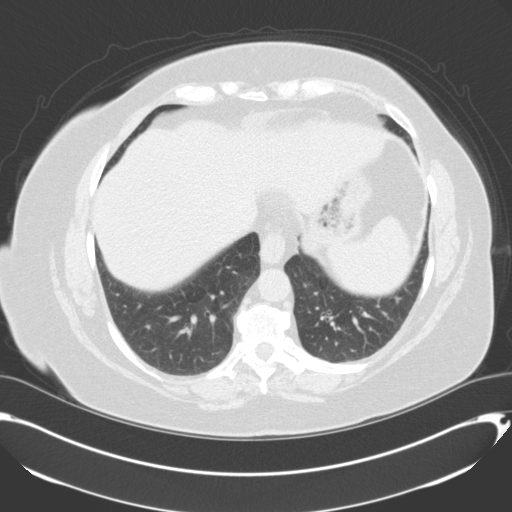
[im 18/99  vessel]
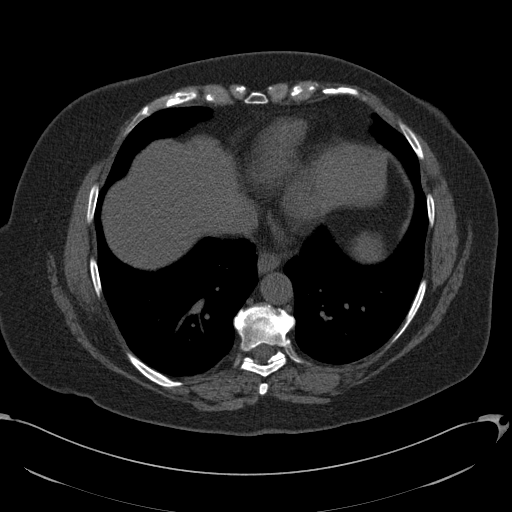
[im 27/99  vessel]
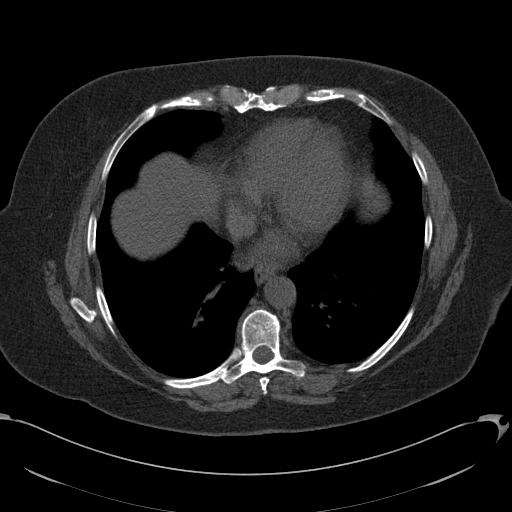
[im 36/99  vessel]
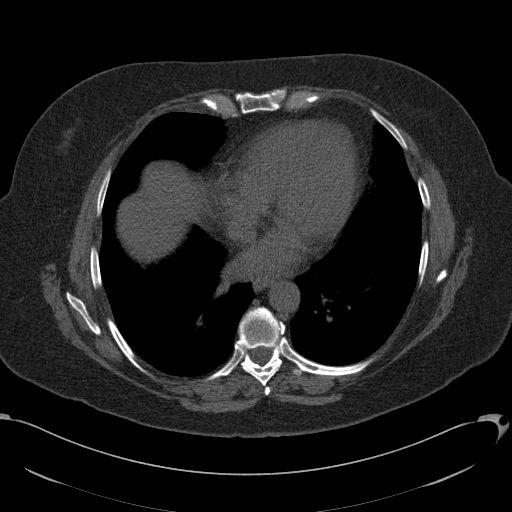
[im 45/99  vessel]
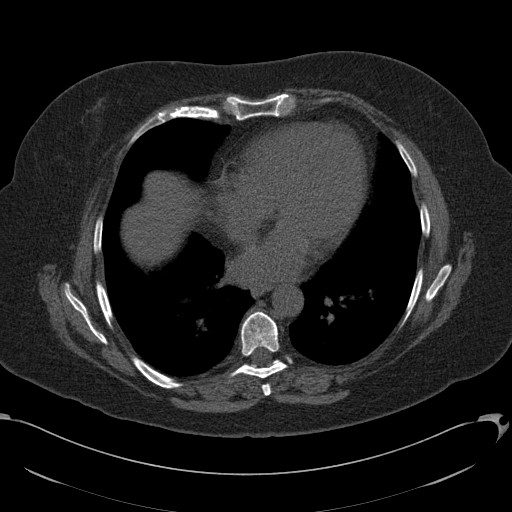
[im 45/99  lung]
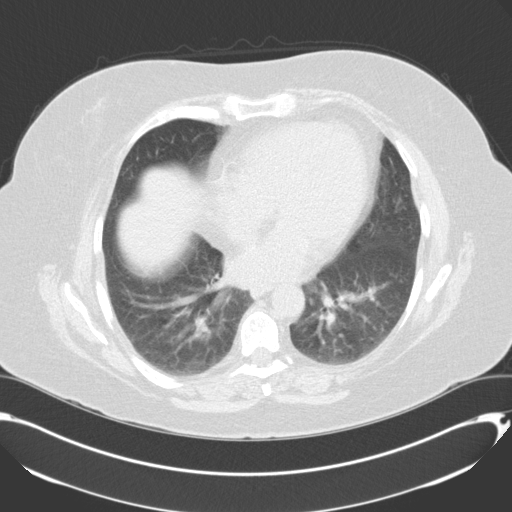
[im 54/99  vessel]
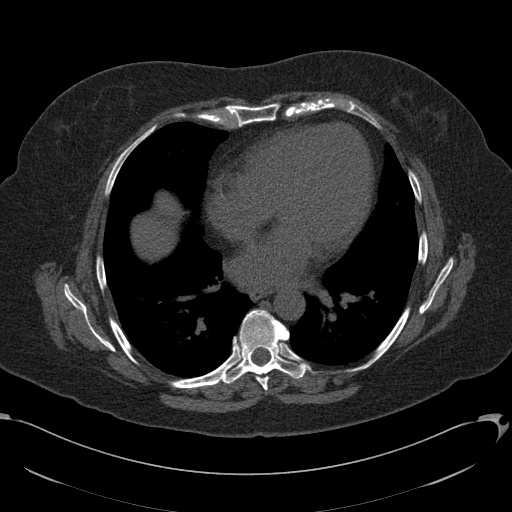
[im 63/99  vessel]
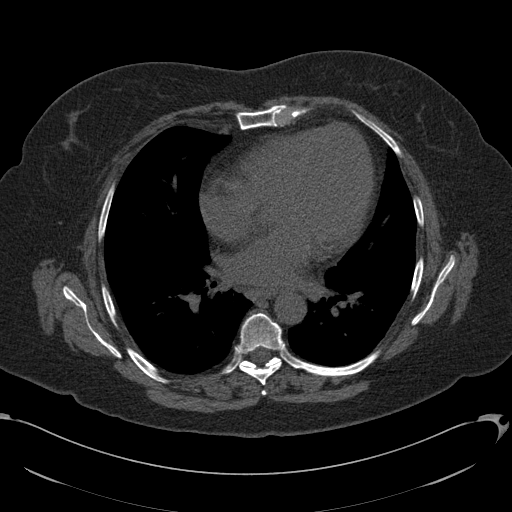
[im 72/99  vessel]
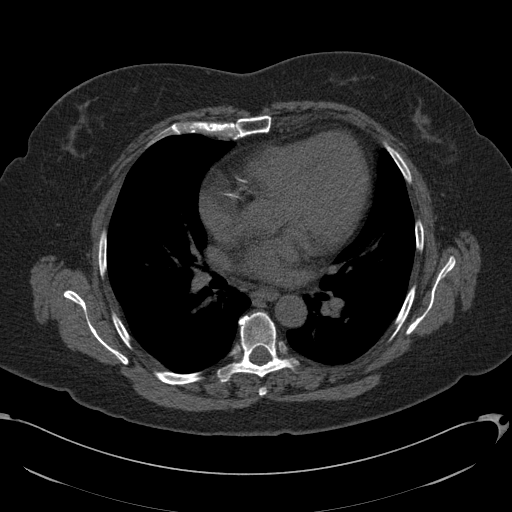
[im 81/99  vessel]
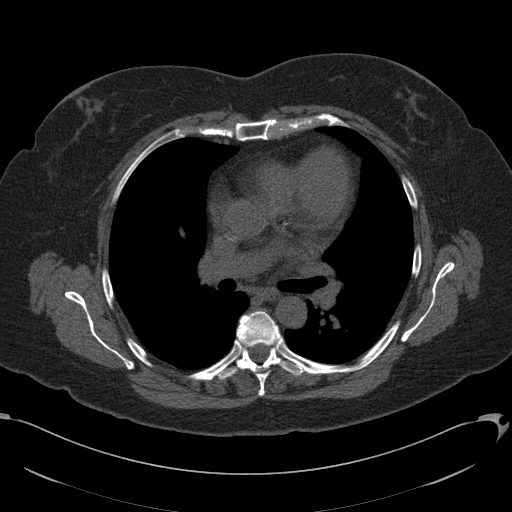
[im 81/99  lung]
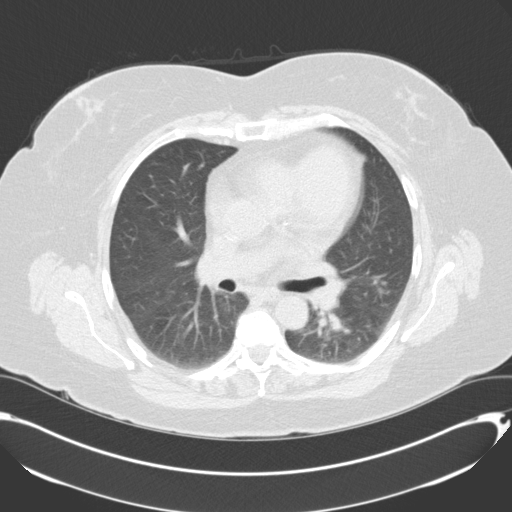
[im 90/99  vessel]
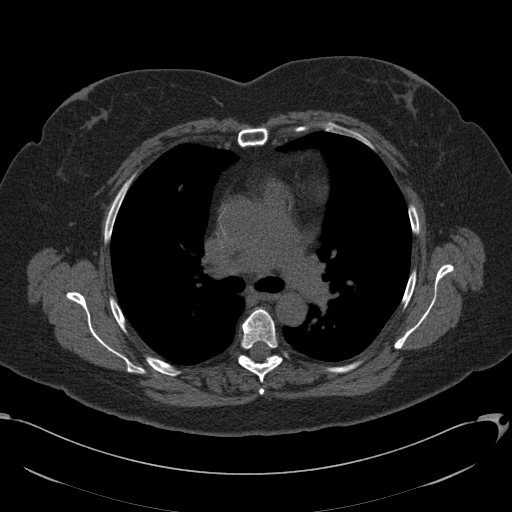

[10 of 20 positions shown; findings below may reference images not displayed]

FINDINGS: Therewas mild calcification of the ascending aortic root measures
3.6 cm

Normalpericardium

Verysmall area of calcium in the proximal LAD with calcium score of
.27
IMPRESSION: Calciumscore less than 1. 33rd percentile forage and sex matched
controls

EXAM:
OVER-READ INTERPRETATION  CT CHEST

The following report is an over-read performed by radiologist Dr.
Sachie Awai [REDACTED] on 03/25/2013. This over-read
does not include interpretation of cardiac or coronary anatomy or
pathology. The coronary calcium score interpretation by the
cardiologist is attached.
FINDINGS: No confluent airspace opacities in the visualized lungs. No
adenopathy in the visualized lower mediastinum or hila. Visualized
aorta is normal caliber. No acute bony findings. Imaging into the
upper abdomen shows no acute findings.
IMPRESSION: No acute or significant extracardiac abnormality.

## 2014-05-24 IMAGING — US US ABDOMEN COMPLETE
1 series · 14 of 25 positions shown · non-contrast
Comparison: CT 05/25/2012

CLINICAL DATA: Right upper quadrant pain.

COMPLETE ABDOMINAL ULTRASOUND

[Series 1: us abdomen complete · 0.27mm/px · 14 of 68 slices shown]
[im 1/68]
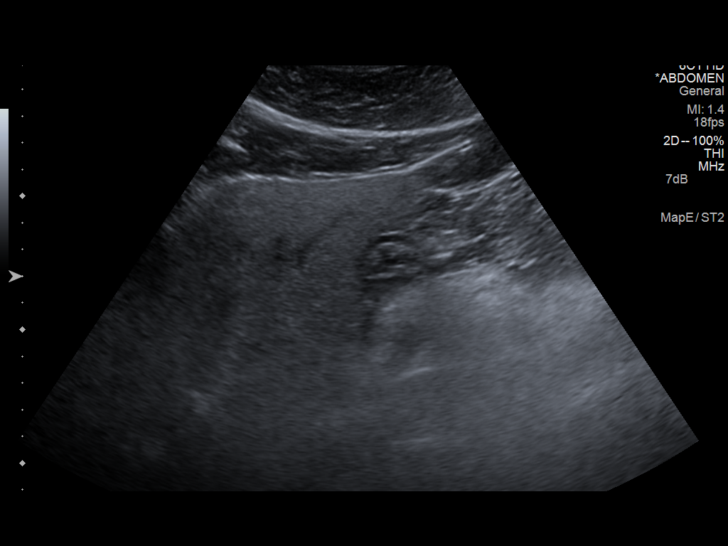
[im 6/68]
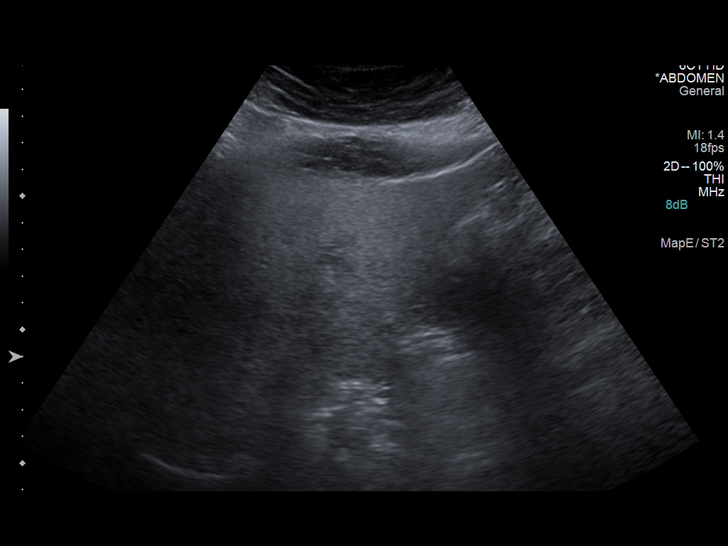
[im 12/68]
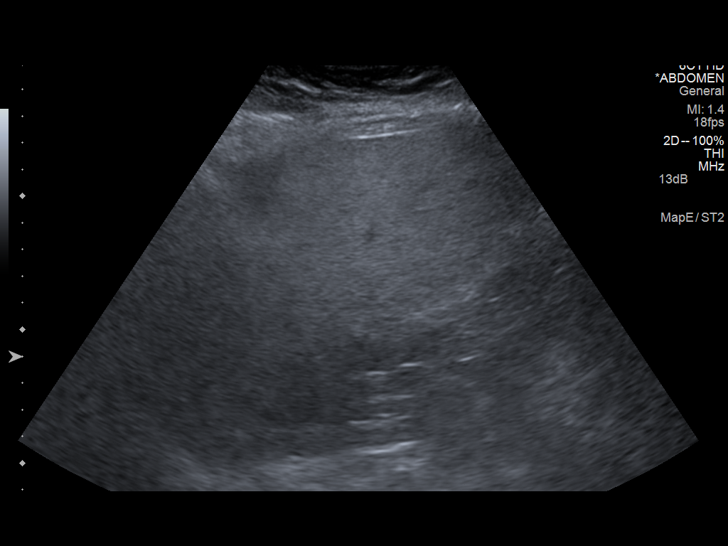
[im 17/68]
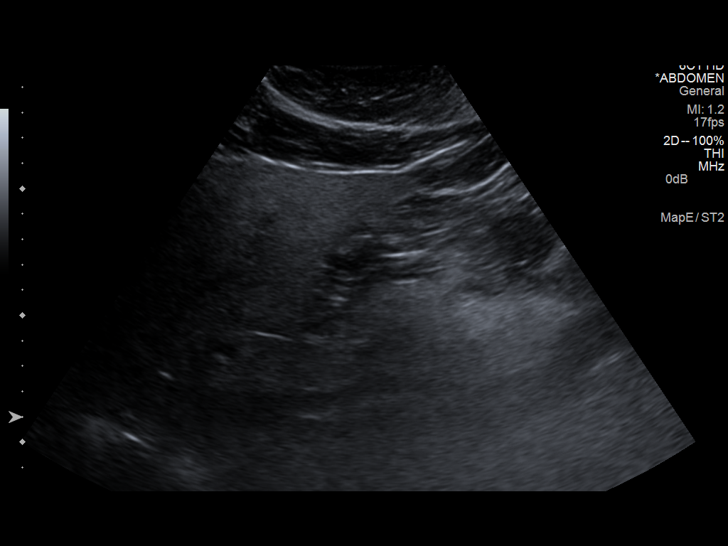
[im 23/68]
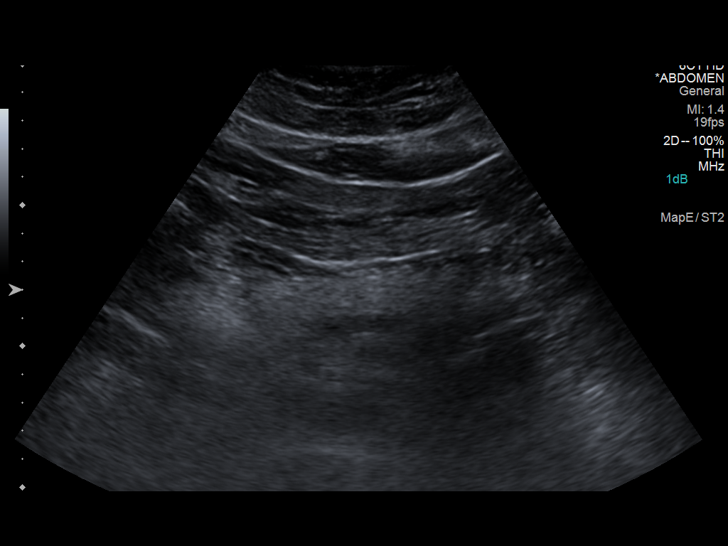
[im 26/68]
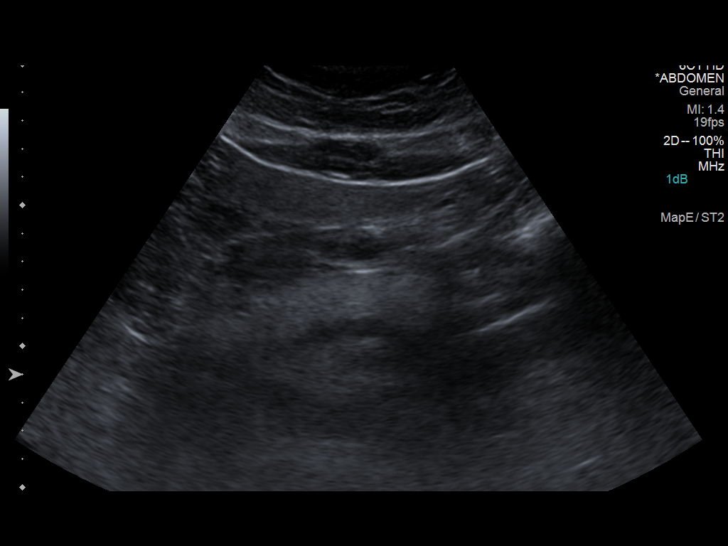
[im 31/68]
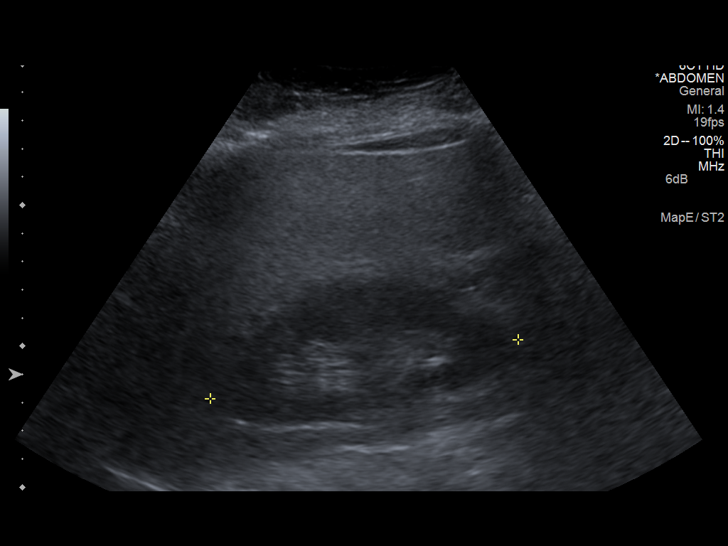
[im 37/68]
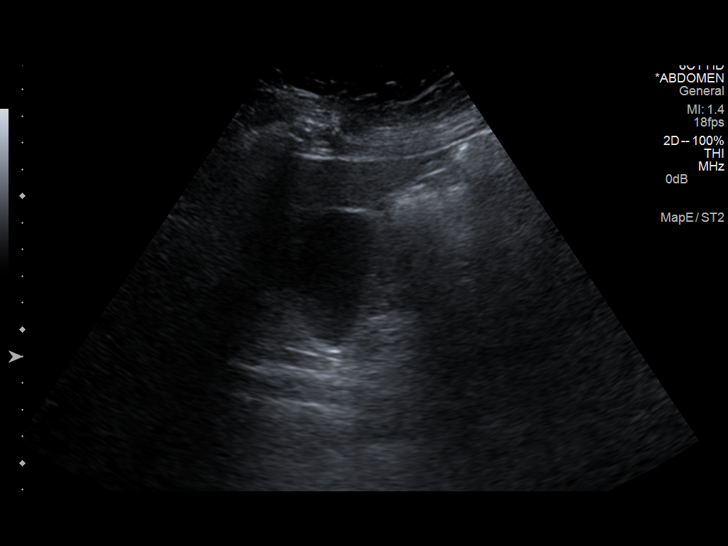
[im 42/68]
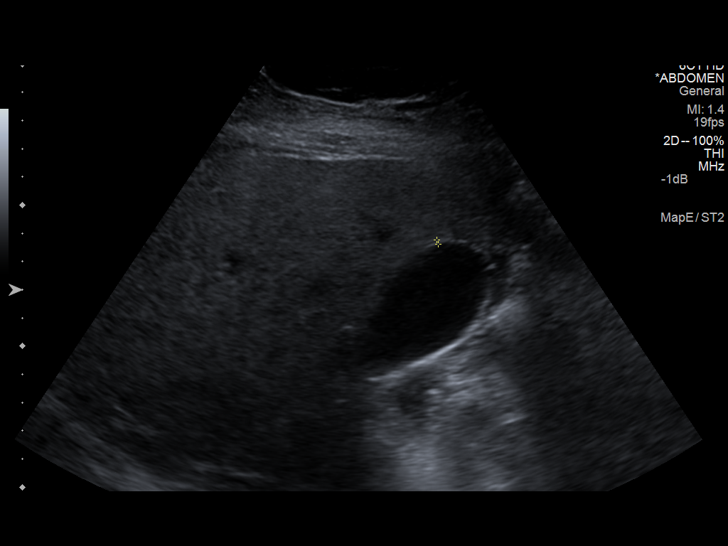
[im 45/68]
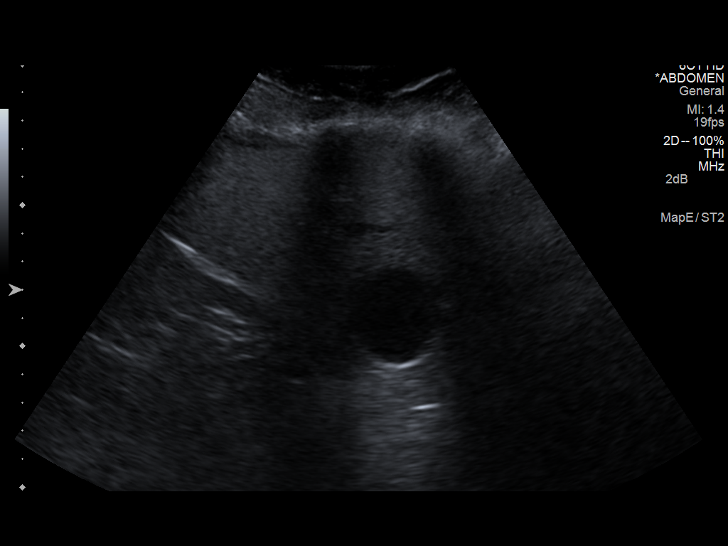
[im 51/68]
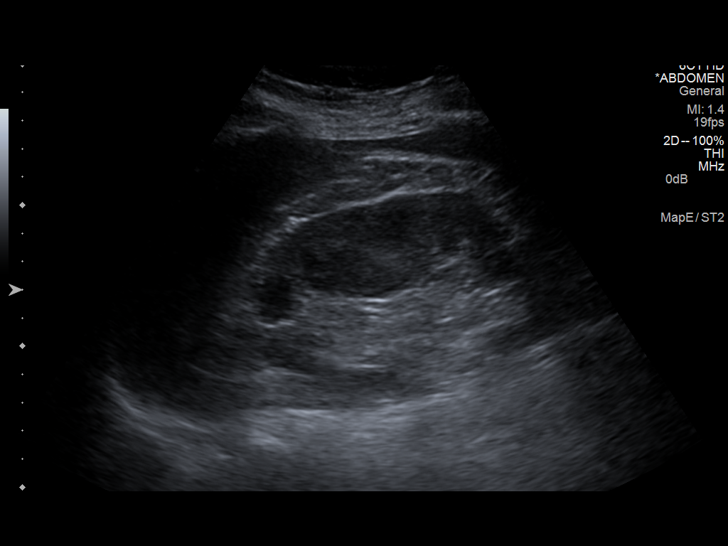
[im 56/68]
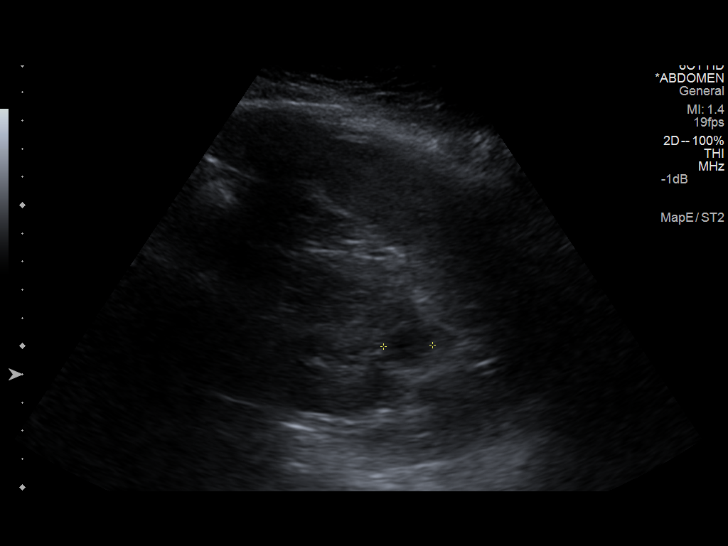
[im 62/68]
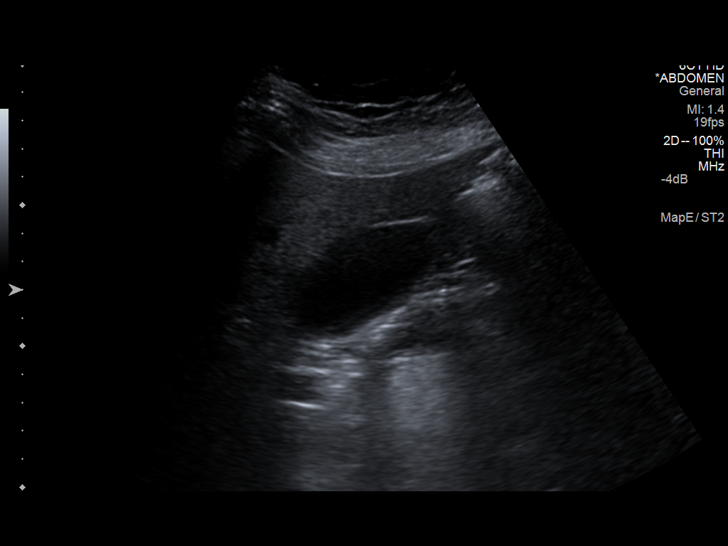
[im 68/68]
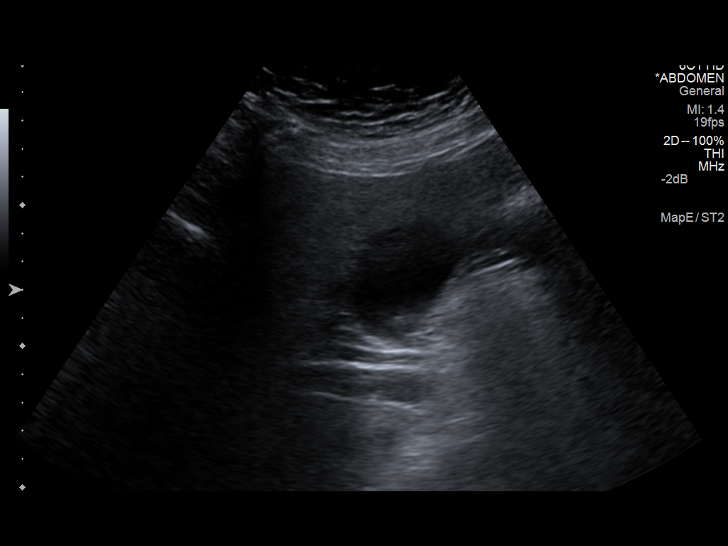

[14 of 25 positions shown; findings below may reference images not displayed]

FINDINGS: Gallbladder:  No gallstones, gallbladder wall thickening, or
pericholecystic fluid.

Common bile duct:  Measures 6.4 mm, mildly dilated.

Liver:  Diffusely increased in echogenicity.

IVC:  Not well visualized

Pancreas:  The pancreatic tail is not well visualized.  The
visualized aspect of the pancreas is grossly unremarkable.

Spleen:  Spleen is unremarkable measures 7.9 cm.

Right Kidney:  Normal renal cortical thickness and echogenicity.
Measures 11.1 cm.

Left Kidney:  Normal renal cortical thickness and echogenicity.
Measures 11.3 cm.  There is a 1.8 cm simple cyst within the
superior pole left kidney.

Abdominal aorta:  Distal abdominal aorta is obscured by overlying
bowel gas.  No definite aneurysm identified.
IMPRESSION: 1.  Hepatic steatosis.
2.  Common bile duct diameter slightly dilated measuring 6.4 mm.
Recommend correlation with LFTs.  If abnormal, consider further
evaluation with MRCP.

## 2014-09-28 ENCOUNTER — Encounter (HOSPITAL_COMMUNITY): Payer: Self-pay

## 2014-09-28 ENCOUNTER — Emergency Department (INDEPENDENT_AMBULATORY_CARE_PROVIDER_SITE_OTHER)
Admission: EM | Admit: 2014-09-28 | Discharge: 2014-09-28 | Disposition: A | Discharge status: Home / Self Care | Payer: BLUE CROSS/BLUE SHIELD | Source: Home / Self Care | Attending: Family Medicine | Admitting: Family Medicine

## 2014-09-28 DIAGNOSIS — R1011 Right upper quadrant pain: Secondary | ICD-10-CM | POA: Diagnosis not present

## 2014-09-28 NOTE — ED Provider Notes (Signed)
[name] is a 59 y.o. female who presents to Urgent Care today for right upper quadrant pain. Patient has right upper quadrant pain radiating to her back. Symptoms present starting at 3 PM today. Pain is improved now. No vomiting or diarrhea. Patient has history of prior gallbladder dysfunction in the past but has not had a cholecystectomy yet. No significant chest pain. No trouble breathing.   Past Medical History  Diagnosis Date  . Hypertension   . SVD (spontaneous vaginal delivery) x 5  . PONV (postoperative nausea and vomiting)   . Hyperlipidemia     diet controlled  . Vitamin D deficiency   . Seasonal allergies   . GERD (gastroesophageal reflux disease)     tums prn  . Headache(784.0)     otc meds prn   Past Surgical History  Procedure Laterality Date  . Leg surgery      left leg with metal plates and pins/screws  . Wisdom tooth extraction    . Laparotomy      removed cyst of left ovary  . Appendectomy    . Tubal ligation    . Leep N/A 01/21/2013    Procedure: LOOP ELECTROSURGICAL EXCISION PROCEDURE (LEEP);  Surgeon: Michael LitterNaima A Dillard, MD;  Location: [facility];  Service: Gynecology;  Laterality: N/A;  WITH ECC   History  Substance Use Topics  . Smoking status: Former Smoker -- 2.00 packs/day for 20 years    Types: Cigarettes    Quit date: 12/23/1992  . Smokeless tobacco: Never Used  . Alcohol Use: No   ROS as above Medications: No current facility-administered medications for this encounter.   Current Outpatient Prescriptions  Medication Sig Dispense Refill  . cholecalciferol (VITAMIN D) 1000 UNITS tablet Take 1,000 Units by mouth daily.    Marland Kitchen. dexlansoprazole (DEXILANT) 60 MG capsule Take 60 mg by mouth daily.    . diphenhydrAMINE (BENADRYL) 25 MG tablet Take 50 mg by mouth every 6 (six) hours as needed for itching or allergies.    Marland Kitchen. lisinopril (PRINIVIL,ZESTRIL) 20 MG tablet Take 20 mg by mouth daily.    . Multiple Vitamin (MULTIVITAMIN WITH MINERALS) TABS Take 1  tablet by mouth daily.     Allergies  Allergen Reactions  . Penicillins Rash     Exam:  BP 189/89 mmHg  Pulse 68  Temp(Src) 98.2 F (36.8 C) (Oral)  Resp 16  SpO2 100% Gen: Well NAD HEENT: EOMI,  MMM Lungs: Normal work of breathing. CTABL Heart: RRR no MRG Abd: NABS, Soft. Nondistended, tender palpation right upper quadrant with positive Murphy sign. Liver edge is large and palpated and percussed on the rib margin. Exts: Brisk capillary refill, warm and well perfused.   No results found for this or any previous visit (from the past 24 hour(s)). No results found.  Assessment and Plan: 59 y.o. female with right upper quadrant pain with palpated hepatomegaly and positive Murphy sign. Pain is improving. Follow back up with general surgeon or gastroenterology for further evaluation of this issue. If worsened go to the emergency room. Low-fat diet. Patient expresses understanding and agreement.  Discussed warning signs or symptoms. Please see discharge instructions. Patient expresses understanding.     Rodolph BongEvan S Corey, MD 09/28/14 [phone]

## 2014-09-28 NOTE — Discharge Instructions (Signed)
Thank you for coming in today. Come back or go to the emergency room if you notice new weakness new numbness problems walking or bowel or bladder problems.   Biliary Colic  Biliary colic is a steady or irregular pain in the upper abdomen. It is usually under the right side of the rib cage. It happens when gallstones interfere with the normal flow of bile from the gallbladder. Bile is a liquid that helps to digest fats. Bile is made in the liver and stored in the gallbladder. When you eat a meal, bile passes from the gallbladder through the cystic duct and the common bile duct into the small intestine. There, it mixes with partially digested food. If a gallstone blocks either of these ducts, the normal flow of bile is blocked. The muscle cells in the bile duct contract forcefully to try to move the stone. This causes the pain of biliary colic.  SYMPTOMS   A person with biliary colic usually complains of pain in the upper abdomen. This pain can be:  In the center of the upper abdomen just below the breastbone.  In the upper-right part of the abdomen, near the gallbladder and liver.  Spread back toward the right shoulder blade.  Nausea and vomiting.  The pain usually occurs after eating.  Biliary colic is usually triggered by the digestive system's demand for bile. The demand for bile is high after fatty meals. Symptoms can also occur when a person who has been fasting suddenly eats a very large meal. Most episodes of biliary colic pass after 1 to 5 hours. After the most intense pain passes, your abdomen may continue to ache mildly for about 24 hours. DIAGNOSIS  After you describe your symptoms, your caregiver will perform a physical exam. He or she will pay attention to the upper right portion of your belly (abdomen). This is the area of your liver and gallbladder. An ultrasound will help your caregiver look for gallstones. Specialized scans of the gallbladder may also be done. Blood tests may be  done, especially if you have fever or if your pain persists. PREVENTION  Biliary colic can be prevented by controlling the risk factors for gallstones. Some of these risk factors, such as heredity, increasing age, and pregnancy are a normal part of life. Obesity and a high-fat diet are risk factors you can change through a healthy lifestyle. Women going through menopause who take hormone replacement therapy (estrogen) are also more likely to develop biliary colic. TREATMENT   Pain medication may be prescribed.  You may be encouraged to eat a fat-free diet.  If the first episode of biliary colic is severe, or episodes of colic keep retuning, surgery to remove the gallbladder (cholecystectomy) is usually recommended. This procedure can be done through small incisions using an instrument called a laparoscope. The procedure often requires a brief stay in the hospital. Some people can leave the hospital the same day. It is the most widely used treatment in people troubled by painful gallstones. It is effective and safe, with no complications in more than 90% of cases.  If surgery cannot be done, medication that dissolves gallstones may be used. This medication is expensive and can take months or years to work. Only small stones will dissolve.  Rarely, medication to dissolve gallstones is combined with a procedure called shock-wave lithotripsy. This procedure uses carefully aimed shock waves to break up gallstones. In many people treated with this procedure, gallstones form again within a few years. PROGNOSIS  If gallstones block your cystic duct or common bile duct, you are at risk for repeated episodes of biliary colic. There is also a 25% chance that you will develop a gallbladder infection(acute cholecystitis), or some other complication of gallstones within 10 to 20 years. If you have surgery, schedule it at a time that is convenient for you and at a time when you are not sick. HOME CARE INSTRUCTIONS     Drink plenty of clear fluids.  Avoid fatty, greasy or fried foods, or any foods that make your pain worse.  Take medications as directed. SEEK MEDICAL CARE IF:   You develop a fever over 100.5 F (38.1 C).  Your pain gets worse over time.  You develop nausea that prevents you from eating and drinking.  You develop vomiting. SEEK IMMEDIATE MEDICAL CARE IF:   You have continuous or severe belly (abdominal) pain which is not relieved with medications.  You develop nausea and vomiting which is not relieved with medications.  You have symptoms of biliary colic and you suddenly develop a fever and shaking chills. This may signal cholecystitis. Call your caregiver immediately.  You develop a yellow color to your skin or the white part of your eyes (jaundice). Document Released: 11/12/2005 Document Revised: 09/03/2011 Document Reviewed: 01/22/2008 [facility]ExitCare Patient Information 2015 [place]. This information is not intended to replace advice given to you by your health care provider. Make sure you discuss any questions you have with your health care provider.  Low-Fat Diet for Pancreatitis or Gallbladder Conditions A low-fat diet can be helpful if you have pancreatitis or a gallbladder condition. With these conditions, your pancreas and gallbladder have trouble digesting fats. A healthy eating plan with less fat will help rest your pancreas and gallbladder and reduce your symptoms. WHAT DO I NEED TO KNOW ABOUT THIS DIET?  Eat a low-fat diet.  Reduce your fat intake to less than 20-30% of your total daily calories. This is less than 50-60 g of fat per day.  Remember that you need some fat in your diet. Ask your dietician what your daily goal should be.  Choose nonfat and low-fat healthy foods. Look for the words "nonfat," "low fat," or "fat free."  As a guide, look on the label and choose foods with less than 3 g of fat per serving. Eat only one serving.  Avoid  alcohol.  Do not smoke. If you need help quitting, talk with your health care provider.  Eat small frequent meals instead of three large heavy meals. WHAT FOODS CAN I EAT? Grains Include healthy grains and starches such as potatoes, wheat bread, fiber-rich cereal, and brown rice. Choose whole grain options whenever possible. In adults, whole grains should account for 45-65% of your daily calories.  Fruits and Vegetables Eat plenty of fruits and vegetables. Fresh fruits and vegetables add fiber to your diet. Meats and Other Protein Sources Eat lean meat such as chicken and pork. Trim any fat off of meat before cooking it. Eggs, fish, and beans are other sources of protein. In adults, these foods should account for 10-35% of your daily calories. Dairy Choose low-fat milk and dairy options. Dairy includes fat and protein, as well as calcium.  Fats and Oils Limit high-fat foods such as fried foods, sweets, baked goods, sugary drinks.  Other Creamy sauces and condiments, such as mayonnaise, can add extra fat. Think about whether or not you need to use them, or use smaller amounts or low fat options. WHAT FOODS  ARE NOT RECOMMENDED?  High fat foods, such as:  [employer].  Ice cream.  [place] toast.  Sweet rolls.  Pizza.  Cheese bread.  Foods covered with batter, butter, creamy sauces, or cheese.  Fried foods.  Sugary drinks and desserts.  Foods that cause gas or bloating Document Released: 06/16/2013 Document Reviewed: 06/16/2013 [facility] Patient Information 2015 [place], [place]. This information is not intended to replace advice given to you by your health care provider. Make sure you discuss any questions you have with your health care provider.

## 2014-09-28 NOTE — ED Notes (Signed)
Patient complains of upper right quadrant pain that radiates to her back Pain started about 3pm today Patient has had this pain in the past and had seen a general surgeon about a year ago  And they talked about removing her gall bladder

## 2017-11-13 ENCOUNTER — Emergency Department (HOSPITAL_COMMUNITY): Payer: BLUE CROSS/BLUE SHIELD

## 2017-11-13 ENCOUNTER — Emergency Department (HOSPITAL_COMMUNITY)
Admission: EM | Admit: 2017-11-13 | Discharge: 2017-11-13 | Disposition: A | Discharge status: Home / Self Care | Payer: BLUE CROSS/BLUE SHIELD | Attending: Emergency Medicine | Admitting: Emergency Medicine

## 2017-11-13 ENCOUNTER — Encounter (HOSPITAL_COMMUNITY): Payer: Self-pay

## 2017-11-13 ENCOUNTER — Other Ambulatory Visit: Payer: Self-pay

## 2017-11-13 DIAGNOSIS — R51 Headache: Secondary | ICD-10-CM | POA: Diagnosis not present

## 2017-11-13 DIAGNOSIS — I10 Essential (primary) hypertension: Secondary | ICD-10-CM | POA: Diagnosis not present

## 2017-11-13 DIAGNOSIS — R519 Headache, unspecified: Secondary | ICD-10-CM

## 2017-11-13 DIAGNOSIS — Z79899 Other long term (current) drug therapy: Secondary | ICD-10-CM | POA: Insufficient documentation

## 2017-11-13 DIAGNOSIS — Z87891 Personal history of nicotine dependence: Secondary | ICD-10-CM | POA: Diagnosis not present

## 2017-11-13 DIAGNOSIS — R42 Dizziness and giddiness: Secondary | ICD-10-CM | POA: Diagnosis present

## 2017-11-13 LAB — CBC
HCT: 41 % (ref 36.0–46.0)
Hemoglobin: 13.4 g/dL (ref 12.0–15.0)
MCH: 28.1 pg (ref 26.0–34.0)
MCHC: 32.7 g/dL (ref 30.0–36.0)
MCV: 86 fL (ref 78.0–100.0)
PLATELETS: 292 10*3/uL (ref 150–400)
RBC: 4.77 MIL/uL (ref 3.87–5.11)
RDW: 12.9 % (ref 11.5–15.5)
WBC: 8 10*3/uL (ref 4.0–10.5)

## 2017-11-13 LAB — BASIC METABOLIC PANEL
ANION GAP: 11 (ref 5–15)
BUN: 12 mg/dL (ref 6–20)
CO2: 22 mmol/L (ref 22–32)
CREATININE: 0.66 mg/dL (ref 0.44–1.00)
Calcium: 8.5 mg/dL — ABNORMAL LOW (ref 8.9–10.3)
Chloride: 107 mmol/L (ref 101–111)
GFR calc Af Amer: >60 mL/min (ref >60)
GFR calc non Af Amer: >60 mL/min (ref >60)
GLUCOSE: 121 mg/dL — AB (ref 65–99)
POTASSIUM: 3.7 mmol/L (ref 3.5–5.1)
Sodium: 140 mmol/L (ref 135–145)

## 2017-11-13 NOTE — ED Provider Notes (Signed)
[facility] EMERGENCY DEPARTMENT Provider Note   CSN: [id number] Arrival date & time: 11/13/17  1147     History   Chief Complaint Chief Complaint  Patient presents with  . Hypertension  . Dizziness    HPI Victoria Shannon is a 62 y.o. female.  HPI Patient is a 62 year old female who presents to the emergency department with complaints of lightheadedness and dizziness that began this morning around 7 or 8:00.  She does have a history of vertigo but states this felt slightly different.  She had a mild left-sided headache.  She denied focal weakness of her arm or leg.  She went to her health clinic at her job and they found her blood pressure to be elevated and that she was sent to the emergency department for further evaluation.  She reports no headache at this time.  She feels fine.  No weakness of her arms or legs.  No history of stroke.  Prior history of tobacco abuse but no longer smokes.  Denies hypertension or hyperlipidemia.  Blood pressure on arrival was 183/98.  No slurred speech or word finding difficulties.   Past Medical History:  Diagnosis Date  . GERD (gastroesophageal reflux disease)    tums prn  . Headache(784.0)    otc meds prn  . Hyperlipidemia    diet controlled  . Hypertension   . PONV (postoperative nausea and vomiting)   . Seasonal allergies   . SVD (spontaneous vaginal delivery) x 5  . Vitamin D deficiency     There are no active problems to display for this patient.   Past Surgical History:  Procedure Laterality Date  . APPENDECTOMY    . LAPAROTOMY     removed cyst of left ovary  . LEEP N/A 01/21/2013   Procedure: LOOP ELECTROSURGICAL EXCISION PROCEDURE (LEEP);  Surgeon: Michael Litter, MD;  Location: [facility];  Service: Gynecology;  Laterality: N/A;  WITH ECC  . LEG SURGERY     left leg with metal plates and pins/screws  . TUBAL LIGATION    . WISDOM TOOTH EXTRACTION       OB History   None      Home Medications     Prior to Admission medications   Medication Sig Start Date End Date Taking? Authorizing Provider  cholecalciferol (VITAMIN D) 1000 UNITS tablet Take 1,000 Units by mouth daily.    [provider]  dexlansoprazole (DEXILANT) 60 MG capsule Take 60 mg by mouth daily.    [provider]  diphenhydrAMINE (BENADRYL) 25 MG tablet Take 50 mg by mouth every 6 (six) hours as needed for itching or allergies.    [provider]  lisinopril (PRINIVIL,ZESTRIL) 20 MG tablet Take 20 mg by mouth daily.    [provider]  Multiple Vitamin (MULTIVITAMIN WITH MINERALS) TABS Take 1 tablet by mouth daily.    [provider]    Family History Family History  Problem Relation Age of Onset  . CAD Mother 20       CAD, died age 55  . Heart disease Mother   . Heart disease Father        AICD.  Died age 82  . CAD Brother 74       Fatal MI  . CAD Brother 41       CABG    Social History Social History   Tobacco Use  . Smoking status: Former Smoker    Packs/day: 2.00    Years: 20.00  Pack years: 40.00    Types: Cigarettes    Last attempt to quit: 12/23/1992    Years since quitting: 24.9  . Smokeless tobacco: Never Used  Substance Use Topics  . Alcohol use: No  . Drug use: No     Allergies   Penicillins   Review of Systems Review of Systems  All other systems reviewed and are negative.    Physical Exam Updated Vital Signs BP (!) 153/63   Pulse 74   Temp (!) 97.5 F (36.4 C) (Oral)   Resp 16   Ht  (1.676 m)   Wt 97.5 kg (215 lb)   SpO2 97%   BMI 34.70 kg/m   Physical Exam  Constitutional: She is oriented to person, place, and time. She appears well-developed and well-nourished. No distress.  HENT:  Head: Normocephalic and atraumatic.  Eyes: Pupils are equal, round, and reactive to light. EOM are normal.  Neck: Normal range of motion.  Cardiovascular: Normal rate, regular rhythm and normal heart sounds.  Pulmonary/Chest:  Effort normal and breath sounds normal.  Abdominal: Soft. She exhibits no distension. There is no tenderness.  Musculoskeletal: Normal range of motion.  Neurological: She is alert and oriented to person, place, and time.  5/5 strength in major muscle groups of  bilateral upper and lower extremities. Speech normal. No facial asymetry.   Skin: Skin is warm and dry.  Psychiatric: She has a normal mood and affect. Judgment normal.  Nursing note and vitals reviewed.    ED Treatments / Results  Labs (all labs ordered are listed, but only abnormal results are displayed) Labs Reviewed  BASIC METABOLIC PANEL - Abnormal; Notable for the following components:      Result Value   Glucose, Bld 121 (*)    Calcium 8.5 (*)    All other components within normal limits  CBC    EKG None  Radiology Ct Head Wo Contrast  Result Date: 11/13/2017 CLINICAL DATA:  Lightheaded and dizzy EXAM: CT HEAD WITHOUT CONTRAST TECHNIQUE: Contiguous axial images were obtained from the base of the skull through the vertex without intravenous contrast. COMPARISON:  CT brain 05/25/2012 FINDINGS: Brain: No evidence of acute infarction, hemorrhage, hydrocephalus, extra-axial collection or mass lesion/mass effect. Vascular: No hyperdense vessels.  Carotid vascular calcification Skull: Normal. Negative for fracture or focal lesion. Sinuses/Orbits: Mucosal thickening in the ethmoid sinuses. No acute orbital abnormality Other: None IMPRESSION: Negative non contrasted CT appearance of the brain for age Electronically Signed   By: Jasmine Pang M.D.   On: 11/13/2017 18:46    Procedures Procedures (including critical care time)  Medications Ordered in ED Medications - No data to display   Initial Impression / Assessment and Plan / ED Course  I have reviewed the triage vital signs and the nursing notes.  Pertinent labs & imaging results that were available during my care of the patient were reviewed by me and considered in  my medical decision making (see chart for details).     Patient feels much better at this time.  She is asymptomatic.  Her labs and head CT are without abnormality.  Blood pressure came down on her own.  Doubt stroke.  Primary care follow-up.  Overall well-appearing and stable for discharge from the emergency department.  Discharged home in good condition.  Final Clinical Impressions(s) / ED Diagnoses   Final diagnoses:  Acute nonintractable headache, unspecified headache type    ED Discharge Orders    None  Azalia Bilis, MD 11/13/17 2103

## 2017-11-13 NOTE — ED Triage Notes (Signed)
Pt endorses some lightheadedness/dizziness that began this morning around 0700 at work. Pt has hx of vertigo. Pt's checked BP and found to be hypertensive, BP in triage 183/98. No neuro deficits.

## 2018-08-12 ENCOUNTER — Emergency Department (HOSPITAL_COMMUNITY)
Admission: EM | Admit: 2018-08-12 | Discharge: 2018-08-13 | Disposition: A | Discharge status: Home / Self Care | Payer: BLUE CROSS/BLUE SHIELD | Attending: Emergency Medicine | Admitting: Emergency Medicine

## 2018-08-12 ENCOUNTER — Emergency Department (HOSPITAL_COMMUNITY): Payer: BLUE CROSS/BLUE SHIELD

## 2018-08-12 DIAGNOSIS — Y999 Unspecified external cause status: Secondary | ICD-10-CM | POA: Diagnosis not present

## 2018-08-12 DIAGNOSIS — Z87891 Personal history of nicotine dependence: Secondary | ICD-10-CM | POA: Insufficient documentation

## 2018-08-12 DIAGNOSIS — M25531 Pain in right wrist: Secondary | ICD-10-CM | POA: Insufficient documentation

## 2018-08-12 DIAGNOSIS — I10 Essential (primary) hypertension: Secondary | ICD-10-CM | POA: Insufficient documentation

## 2018-08-12 DIAGNOSIS — W19XXXA Unspecified fall, initial encounter: Secondary | ICD-10-CM

## 2018-08-12 DIAGNOSIS — Y929 Unspecified place or not applicable: Secondary | ICD-10-CM | POA: Diagnosis not present

## 2018-08-12 DIAGNOSIS — Y9389 Activity, other specified: Secondary | ICD-10-CM | POA: Insufficient documentation

## 2018-08-12 DIAGNOSIS — M25532 Pain in left wrist: Secondary | ICD-10-CM | POA: Diagnosis not present

## 2018-08-12 DIAGNOSIS — S80212A Abrasion, left knee, initial encounter: Secondary | ICD-10-CM | POA: Diagnosis not present

## 2018-08-12 DIAGNOSIS — M25521 Pain in right elbow: Secondary | ICD-10-CM | POA: Insufficient documentation

## 2018-08-12 DIAGNOSIS — W01198A Fall on same level from slipping, tripping and stumbling with subsequent striking against other object, initial encounter: Secondary | ICD-10-CM | POA: Diagnosis not present

## 2018-08-12 NOTE — ED Triage Notes (Addendum)
Pt fell 1 hr PTA.  Complaining of left wrist and arm pain also left knee pain.  A&Ox4, no dizziness or ataxia.  Ambulatory to triage.

## 2018-08-13 ENCOUNTER — Emergency Department (HOSPITAL_COMMUNITY): Payer: BLUE CROSS/BLUE SHIELD

## 2018-08-13 MED ORDER — IBUPROFEN 800 MG PO TABS
800.0000 mg | ORAL_TABLET | Freq: Once | ORAL | Status: AC
  Administered 2018-08-13: 800 mg via ORAL
  Filled 2018-08-13: qty 1

## 2018-08-13 MED ORDER — BACITRACIN ZINC 500 UNIT/GM EX OINT
TOPICAL_OINTMENT | Freq: Two times a day (BID) | CUTANEOUS | Status: DC
  Administered 2018-08-13: 2 via TOPICAL

## 2018-08-13 NOTE — ED Provider Notes (Signed)
[facility]   CSN: [id number] Arrival date & time: 08/12/18  2047    History   Chief Complaint Chief Complaint  Patient presents with  . Fall    HPI Victoria Shannon is a 63 y.o. female presenting for evaluation after fall.  Patient states she was walking her dog when she got tripped up, fell landing onto concrete.  She denies hitting her head or loss of consciousness.  She reports pain mostly of her left arm and knee immediately after the fall.  This occurred just 1 hour prior to arrival, approximately 10 hours ago.  Patient states that over the past 10 hours, she has developed gradual soreness of her back.  She also reports increasing pain of her right elbow and wrist.  She has not taken anything for pain including Tylenol or ibuprofen.  Pain is constant, worse with palpation and movement.  Nothing makes it better.  She denies neck pain.  She has ambulated since.  She has no medical problems, takes no medications daily.  She is not on blood thinners.    HPI  Past Medical History:  Diagnosis Date  . GERD (gastroesophageal reflux disease)    tums prn  . Headache(784.0)    otc meds prn  . Hyperlipidemia    diet controlled  . Hypertension   . PONV (postoperative nausea and vomiting)   . Seasonal allergies   . SVD (spontaneous vaginal delivery) x 5  . Vitamin D deficiency     There are no active problems to display for this patient.   Past Surgical History:  Procedure Laterality Date  . APPENDECTOMY    . LAPAROTOMY     removed cyst of left ovary  . LEEP N/A 01/21/2013   Procedure: LOOP ELECTROSURGICAL EXCISION PROCEDURE (LEEP);  Surgeon: Michael LitterNaima A Dillard, MD;  Location: [facility];  Service: Gynecology;  Laterality: N/A;  WITH ECC  . LEG SURGERY     left leg with metal plates and pins/screws  . TUBAL LIGATION    . WISDOM TOOTH EXTRACTION       OB History   No obstetric history on file.      Home Medications     Prior to Admission medications   Medication Sig Start Date End Date Taking? Authorizing Provider  diphenhydrAMINE (BENADRYL) 25 MG tablet Take 25-50 mg by mouth every 6 (six) hours as needed for itching or allergies.     [provider]  ibuprofen (ADVIL,MOTRIN) 200 MG tablet Take 200-400 mg by mouth every 6 (six) hours as needed (for pain or headaches).    [provider]  Ibuprofen-diphenhydrAMINE Cit (ADVIL PM) 200-38 MG TABS Take 1 tablet by mouth at bedtime as needed (for sleep).    [provider]  ranitidine (ZANTAC) 150 MG tablet Take 150 mg by mouth daily as needed for heartburn.     [provider]    Family History Family History  Problem Relation Age of Onset  . CAD Mother 4142       CAD, died age 65  . Heart disease Mother   . Heart disease Father        AICD.  Died age 65  . CAD Brother 647       Fatal MI  . CAD Brother 3352       CABG    Social History Social History   Tobacco Use  . Smoking status: Former Smoker    Packs/day: 2.00  Years: 20.00    Pack years: 40.00    Types: Cigarettes    Last attempt to quit: 12/23/1992    Years since quitting: 25.6  . Smokeless tobacco: Never Used  Substance Use Topics  . Alcohol use: No  . Drug use: No     Allergies   Lisinopril and Penicillins   Review of Systems Review of Systems  Musculoskeletal: Positive for arthralgias and myalgias.  Neurological: Negative for numbness.     Physical Exam Updated Vital Signs BP (!) 157/79 (BP Location: Right Arm)   Pulse 90   Temp 98.3 F (36.8 C) (Oral)   Resp 17   Ht 5\' 5"  (1.651 m)   Wt 99.8 kg   SpO2 96%   BMI 36.61 kg/m   Physical Exam Vitals signs and nursing note reviewed.  Constitutional:      General: She is not in acute distress.    Appearance: She is well-developed.     Comments: Appears nontoxic  HENT:     Head: Normocephalic and atraumatic.     Comments: No obvious head injury Eyes:     Extraocular  Movements: Extraocular movements intact.     Conjunctiva/sclera: Conjunctivae normal.     Pupils: Pupils are equal, round, and reactive to light.  Neck:     Musculoskeletal: Normal range of motion and neck supple.     Comments: Full active range of motion of the neck without pain.  No tenderness palpation of the midline C-spine. Cardiovascular:     Rate and Rhythm: Normal rate and regular rhythm.     Pulses: Normal pulses.  Pulmonary:     Effort: Pulmonary effort is normal.     Breath sounds: Normal breath sounds. No rhonchi or rales.  Chest:     Chest wall: No tenderness.  Abdominal:     General: Abdomen is flat. There is no distension.     Palpations: There is no mass.     Tenderness: There is no abdominal tenderness. There is no guarding or rebound.     Comments: No tenderness palpation of the abdomen.  Musculoskeletal: Normal range of motion.        General: Tenderness present.     Comments: Abrasion of bilateral palms and left knee.  No active bleeding.  No other obvious deformity.  Strength of lower extremities intact bilaterally.  Tenderness palpation overlying the abrasion of the left knee, no tenderness palpation elsewhere in the knee.  Able to perform straight leg raise without difficulty. Tenderness palpation of bilateral palms overlying abrasion.  Decreased range of motion of the wrist due to pain.  Good cap refill, sensation, and strength against resistance of all fingers.  Patient reports soreness with palpation of the left forearm and elbow.  Patient reports pain with palpation of the right elbow.  No tenderness palpation of upper arms or shoulders. Tenderness palpation of bilateral back musculature without increased pain over midline spine.  No step-offs or deformities.  Patient able to sit up in bed without difficulty.  Skin:    General: Skin is warm.     Capillary Refill: Capillary refill takes less than 2 seconds.     Findings: No rash.  Neurological:     Mental  Status: She is alert and oriented to person, place, and time.      ED Treatments / Results  Labs (all labs ordered are listed, but only abnormal results are displayed) Labs Reviewed - No data to display  EKG None  Radiology  Dg Elbow Complete Right  Result Date: 08/13/2018 CLINICAL DATA:  Right elbow and wrist pain after fall today. Initial encounter. EXAM: RIGHT ELBOW - COMPLETE 3+ VIEW COMPARISON:  None. FINDINGS: There is no evidence of fracture, dislocation, or joint effusion. Mild spurring at the medial compartment. IMPRESSION: No acute finding. Electronically Signed   By: Marnee SpringJonathon  Watts M.D.   On: 08/13/2018 06:59   Dg Forearm Left  Result Date: 08/12/2018 CLINICAL DATA:  Pain after fall EXAM: LEFT FOREARM - 2 VIEW COMPARISON:  None. FINDINGS: There is no evidence of fracture or other focal bone lesions. No joint dislocation is identified at the elbow. The carpal bones are intact. There is slight dorsal soft tissue swelling. IMPRESSION: Slight dorsal soft tissue swelling. No acute osseous abnormality. Electronically Signed   By: Tollie Ethavid  Kwon M.D.   On: 08/12/2018 22:59   Dg Wrist Complete Left  Result Date: 08/12/2018 CLINICAL DATA:  Generalized left wrist pain and distal forearm pain after fall today. EXAM: LEFT WRIST - COMPLETE 3+ VIEW COMPARISON:  None. FINDINGS: There is no evidence of fracture or dislocation. There is no evidence of arthropathy or other focal bone abnormality. Mild soft tissue swelling along the dorsum the distal forearm. IMPRESSION: No acute osseous abnormality. Mild soft tissue swelling along the dorsum of the distal forearm. Electronically Signed   By: Tollie Ethavid  Kwon M.D.   On: 08/12/2018 22:50   Dg Wrist Complete Right  Result Date: 08/13/2018 CLINICAL DATA:  Right wrist tenderness after fall today. Initial encounter. EXAM: RIGHT WRIST - COMPLETE 3+ VIEW COMPARISON:  None. FINDINGS: There is no evidence of fracture or dislocation. There is no evidence of  arthropathy or other focal bone abnormality. Soft tissues are unremarkable. IMPRESSION: Negative. Electronically Signed   By: Marnee SpringJonathon  Watts M.D.   On: 08/13/2018 06:54   Dg Knee 2 Views Left  Result Date: 08/12/2018 CLINICAL DATA:  Anterior left knee pain after fall today on sidewalk. EXAM: LEFT KNEE - 1-2 VIEW COMPARISON:  None. FINDINGS: Mild tricompartmental osteoarthritis of the knee with spurring. Small joint effusion is identified in the suprapatellar compartment. No acute appearing fracture or joint dislocation. IMPRESSION: Tricompartmental osteoarthritis with small joint effusion. No acute appearing fracture or joint dislocation. Electronically Signed   By: Tollie Ethavid  Kwon M.D.   On: 08/12/2018 22:54    Procedures Procedures (including critical care time)  Medications Ordered in ED Medications  bacitracin ointment (2 application Topical Given 08/13/18 0649)  ibuprofen (ADVIL,MOTRIN) tablet 800 mg (800 mg Oral Given 08/13/18 [id number])     Initial Impression / Assessment and Plan / ED Course  I have reviewed the triage vital signs and the nursing notes.  Pertinent labs & imaging results that were available during my care of the patient were reviewed by me and considered in my medical decision making (see chart for details).      Patient presenting for evaluation after mechanical fall.  Physical exam reassuring, she is neurovascularly intact.  No obvious head injury.  X-ray of left wrist, forearm, knee viewed interpreted by me, no fracture or dislocation.  However, patient also having increased pain of her right elbow and wrist.  As such, will obtain x-rays of these locations.  Wounds cleaned and dressed.  X-ray of right elbow and wrist viewed interpreted by me, no fracture dislocation.  Discussed findings with patient.  Discussed likely muscular pain, and typical course of muscle stiffness.  Discuss symptomatic treatment with Tylenol and ibuprofen.  Encourage follow-up with PCP in 1  week if  symptoms not improving.  At this time, patient appears safe for discharge.  Return precautions given.  Patient states she understands and agrees to plan.    Final Clinical Impressions(s) / ED Diagnoses   Final diagnoses:  Right wrist pain  Left wrist pain  Right elbow pain  Abrasion of left knee, initial encounter  Fall, initial encounter    ED Discharge Orders    None       Alveria Apley, PA-C 08/13/18 0755    Ward, Layla Maw, DO 08/17/18 0031

## 2018-08-13 NOTE — Discharge Instructions (Addendum)
Take ibuprofen 3 times a day with meals.  Do not take other anti-inflammatories at the same time (Advil, Motrin, naproxen, Aleve). You may supplement with Tylenol if you need further pain control. °Use muscle creams such as salonpas, icy hot, bengay. °Use ice packs or heating pads if this helps control your pain. °You will likely have continued muscle stiffness and soreness over the next couple days.  Follow-up with primary care in 1 week if your symptoms are not improving. °Return to the emergency room if you develop vision changes, vomiting, slurred speech, numbness, loss of bowel or bladder control, or any new or worsening symptoms. ° °

## 2019-01-11 ENCOUNTER — Other Ambulatory Visit: Payer: Self-pay

## 2019-01-11 ENCOUNTER — Encounter (HOSPITAL_COMMUNITY): Payer: Self-pay

## 2019-01-11 ENCOUNTER — Emergency Department (HOSPITAL_COMMUNITY)
Admission: EM | Admit: 2019-01-11 | Discharge: 2019-01-11 | Disposition: A | Discharge status: Home / Self Care | Payer: BLUE CROSS/BLUE SHIELD | Attending: Emergency Medicine | Admitting: Emergency Medicine

## 2019-01-11 ENCOUNTER — Emergency Department (HOSPITAL_COMMUNITY): Payer: BLUE CROSS/BLUE SHIELD

## 2019-01-11 DIAGNOSIS — Z87891 Personal history of nicotine dependence: Secondary | ICD-10-CM | POA: Insufficient documentation

## 2019-01-11 DIAGNOSIS — R0789 Other chest pain: Secondary | ICD-10-CM | POA: Insufficient documentation

## 2019-01-11 DIAGNOSIS — E785 Hyperlipidemia, unspecified: Secondary | ICD-10-CM | POA: Insufficient documentation

## 2019-01-11 DIAGNOSIS — I10 Essential (primary) hypertension: Secondary | ICD-10-CM | POA: Insufficient documentation

## 2019-01-11 DIAGNOSIS — Z888 Allergy status to other drugs, medicaments and biological substances status: Secondary | ICD-10-CM | POA: Insufficient documentation

## 2019-01-11 DIAGNOSIS — Z880 Allergy status to penicillin: Secondary | ICD-10-CM | POA: Insufficient documentation

## 2019-01-11 LAB — BASIC METABOLIC PANEL
Anion gap: 15 (ref 5–15)
BUN: 18 mg/dL (ref 8–23)
CO2: 20 mmol/L — ABNORMAL LOW (ref 22–32)
Calcium: 9.7 mg/dL (ref 8.9–10.3)
Chloride: 103 mmol/L (ref 98–111)
Creatinine, Ser: 0.68 mg/dL (ref 0.44–1.00)
GFR calc Af Amer: >60 mL/min (ref >60)
GFR calc non Af Amer: >60 mL/min (ref >60)
Glucose, Bld: 142 mg/dL — ABNORMAL HIGH (ref 70–99)
Potassium: 4.1 mmol/L (ref 3.5–5.1)
Sodium: 138 mmol/L (ref 135–145)

## 2019-01-11 LAB — CBC
HCT: 42.8 % (ref 36.0–46.0)
Hemoglobin: 13.7 g/dL (ref 12.0–15.0)
MCH: 28.7 pg (ref 26.0–34.0)
MCHC: 32 g/dL (ref 30.0–36.0)
MCV: 89.7 fL (ref 80.0–100.0)
Platelets: 290 10*3/uL (ref 150–400)
RBC: 4.77 MIL/uL (ref 3.87–5.11)
RDW: 13.3 % (ref 11.5–15.5)
WBC: 9 10*3/uL (ref 4.0–10.5)
nRBC: 0 % (ref 0.0–0.2)

## 2019-01-11 LAB — TROPONIN I (HIGH SENSITIVITY)
Troponin I (High Sensitivity): 10 ng/L (ref <18)
Troponin I (High Sensitivity): 11 ng/L (ref <18)

## 2019-01-11 LAB — D-DIMER, QUANTITATIVE: D-Dimer, Quant: 0.3 ug/mL-FEU (ref 0.00–0.50)

## 2019-01-11 MED ORDER — KETOROLAC TROMETHAMINE 30 MG/ML IJ SOLN: 30.0000 mg | Freq: Once | INTRAMUSCULAR | Status: DC

## 2019-01-11 MED ORDER — ASPIRIN 81 MG PO CHEW
324.0000 mg | CHEWABLE_TABLET | Freq: Once | ORAL | Status: AC
  Administered 2019-01-11: 324 mg via ORAL
  Filled 2019-01-11: qty 4

## 2019-01-11 MED ORDER — KETOROLAC TROMETHAMINE 30 MG/ML IJ SOLN
15.0000 mg | Freq: Once | INTRAMUSCULAR | Status: AC
  Administered 2019-01-11: 15 mg via INTRAVENOUS
  Filled 2019-01-11: qty 1

## 2019-01-11 NOTE — ED Provider Notes (Signed)
[facility] DEPT Provider Note   CSN: [id number] Arrival date & time: 01/11/19  0320    History   Chief Complaint Chief Complaint  Patient presents with  . Chest Pain    HPI Victoria Shannon is a 63 y.o. female.   The history is provided by the patient.  Chest Pain She has history of hypertension and hyperlipidemia and comes in having been awakened at about midnight with a sense of difficulty breathing.  This is followed by a dull ache in the posterior aspect of the left shoulder.  She also noted a knot come up in the right infrascapular area.  She rates her pain at 3/10.  There was some transient nausea and she did break out in a sweat.  She denies vomiting.  She denies fever or chills.  Of note, she was recently treated for an infection in her right leg and states that she was having difficulty ambulating until the infection came under control.  She is reported to have had some blood work which showed evidence of inflammation.  She is a non-smoker.  There is a positive family history for premature coronary atherosclerosis, although family members with heart disease have also been heavy smokers.  Past Medical History:  Diagnosis Date  . GERD (gastroesophageal reflux disease)    tums prn  . Headache(784.0)    otc meds prn  . Hyperlipidemia    diet controlled  . Hypertension   . PONV (postoperative nausea and vomiting)   . Seasonal allergies   . SVD (spontaneous vaginal delivery) x 5  . Vitamin D deficiency     There are no active problems to display for this patient.   Past Surgical History:  Procedure Laterality Date  . APPENDECTOMY    . LAPAROTOMY     removed cyst of left ovary  . LEEP N/A 01/21/2013   Procedure: LOOP ELECTROSURGICAL EXCISION PROCEDURE (LEEP);  Surgeon: Betsy Coder, MD;  Location: [facility];  Service: Gynecology;  Laterality: N/A;  WITH ECC  . LEG SURGERY     left leg with metal plates and pins/screws  . TUBAL LIGATION     . WISDOM TOOTH EXTRACTION       OB History   No obstetric history on file.      Home Medications    Prior to Admission medications   Medication Sig Start Date End Date Taking? Authorizing Provider  diphenhydrAMINE (BENADRYL) 25 MG tablet Take 25-50 mg by mouth every 6 (six) hours as needed for itching or allergies.     [provider]  ibuprofen (ADVIL,MOTRIN) 200 MG tablet Take 200-400 mg by mouth every 6 (six) hours as needed (for pain or headaches).    [provider]  Ibuprofen-diphenhydrAMINE Cit (ADVIL PM) 200-38 MG TABS Take 1 tablet by mouth at bedtime as needed (for sleep).    [provider]  ranitidine (ZANTAC) 150 MG tablet Take 150 mg by mouth daily as needed for heartburn.     [provider]    Family History Family History  Problem Relation Age of Onset  . CAD Mother 50       CAD, died age 31  . Heart disease Mother   . Heart disease Father        AICD.  Died age 16  . CAD Brother 5       Fatal MI  . CAD Brother 69       CABG    Social History Social  History   Tobacco Use  . Smoking status: Former Smoker    Packs/day: 2.00    Years: 20.00    Pack years: 40.00    Types: Cigarettes    Quit date: 12/23/1992    Years since quitting: 26.0  . Smokeless tobacco: Never Used  Substance Use Topics  . Alcohol use: No  . Drug use: No     Allergies   Lisinopril and Penicillins   Review of Systems Review of Systems  Cardiovascular: Positive for chest pain.  All other systems reviewed and are negative.    Physical Exam Updated Vital Signs BP (!) 217/89 (BP Location: Right Arm)   Pulse 79   Temp 98.7 F (37.1 C) (Oral)   Resp 18   Ht 5\' 5"  (1.651 m)   Wt 97.5 kg   SpO2 99%   BMI 35.78 kg/m   Physical Exam Vitals signs and nursing note reviewed.    63 year old female, resting comfortably and in no acute distress. Vital signs are significant for elevated blood pressure. Oxygen saturation is 99%, which  is normal. Head is normocephalic and atraumatic. PERRLA, EOMI. Oropharynx is clear. Neck is nontender and supple without adenopathy or JVD. Back is nontender and there is no CVA tenderness. Lungs are clear without rales, wheezes, or rhonchi. Chest is nontender. Heart has regular rate and rhythm without murmur. Abdomen is soft, flat, nontender without masses or hepatosplenomegaly and peristalsis is normoactive. Extremities have no cyanosis or edema, full range of motion is present. Skin is warm and dry without rash. Neurologic: Mental status is normal, cranial nerves are intact, there are no motor or sensory deficits.  ED Treatments / Results  Labs (all labs ordered are listed, but only abnormal results are displayed) Labs Reviewed  BASIC METABOLIC PANEL - Abnormal; Notable for the following components:      Result Value   CO2 20 (*)    Glucose, Bld 142 (*)    All other components within normal limits  CBC  D-DIMER, QUANTITATIVE (NOT AT [facility])  TROPONIN I (HIGH SENSITIVITY)  TROPONIN I (HIGH SENSITIVITY)    EKG EKG Interpretation  Date/Time:  Sunday January 11 2019 03:33:56 EDT Ventricular Rate:  75 PR Interval:    QRS Duration: 155 QT Interval:  427 QTC Calculation: 477 R Axis:   9 Text Interpretation:  Sinus rhythm Left bundle branch block When compared with ECG of 11/13/2017, No significant change was found Confirmed by Dione BoozeGlick, Jerl Munyan ([id number]) on 01/11/2019 3:37:47 AM   Radiology Dg Chest 2 View  Result Date: 01/11/2019 CLINICAL DATA:  Left-sided chest pain. Shortness of breath. Nausea. EXAM: CHEST - 2 VIEW COMPARISON:  CT 03/25/2013 FINDINGS: The cardiomediastinal contours are normal. The lungs are clear. Pulmonary vasculature is normal. No consolidation, pleural effusion, or pneumothorax. No acute osseous abnormalities are seen. IMPRESSION: No acute chest findings. Electronically Signed   By: Narda RutherfordMelanie  Sanford M.D.   On: 01/11/2019 03:59    Procedures Procedures   Medications Ordered in ED Medications  aspirin chewable tablet 324 mg (324 mg Oral Given 01/11/19 0505)  ketorolac (TORADOL) 30 MG/ML injection 15 mg (15 mg Intravenous Given 01/11/19 0506)     Initial Impression / Assessment and Plan / ED Course  I have reviewed the triage vital signs and the nursing notes.  Pertinent labs & imaging results that were available during my care of the patient were reviewed by me and considered in my medical decision making (see chart for details).  Chest pain  which is somewhat atypical.  Dyspnea which is subjective, normal oxygen saturation level.  Recent relatively immobility does put her at risk for pulmonary embolism, will screen with d-dimer.  ECG shows left bundle branch block which is unchanged from prior.  Chest x-ray is unremarkable.  She is given a dose of ketorolac and a dose of aspirin.  Pain has subsided but not completely gone.  Troponin is normal x2.  She is felt to be safe for discharge at this point.  Blood pressure has come down significantly.  She is referred back to her PCP for reevaluation in several days.  Return precautions discussed.  Final Clinical Impressions(s) / ED Diagnoses   Final diagnoses:  Atypical chest pain    ED Discharge Orders    None       Dione BoozeGlick, Chesky Heyer, MD 01/11/19 [phone]

## 2019-01-11 NOTE — ED Triage Notes (Signed)
Pt reports L chest pain, SOB, and nausea starting at midnight. Hypertension noted.

## 2019-01-11 NOTE — Discharge Instructions (Signed)
Return if symptoms are getting worse. °
